# Patient Record
Sex: Female | Born: 1945
Health system: Southern US, Community
[De-identification: ages and names within clinical notes are randomized; demographics above are authoritative.]

## PROBLEM LIST (undated history)

## (undated) DIAGNOSIS — I517 Cardiomegaly: Secondary | ICD-10-CM

## (undated) DIAGNOSIS — I10 Essential (primary) hypertension: Secondary | ICD-10-CM

## (undated) DIAGNOSIS — M719 Bursopathy, unspecified: Secondary | ICD-10-CM

## (undated) DIAGNOSIS — M1990 Unspecified osteoarthritis, unspecified site: Secondary | ICD-10-CM

## (undated) DIAGNOSIS — N8110 Cystocele, unspecified: Secondary | ICD-10-CM

## (undated) DIAGNOSIS — J329 Chronic sinusitis, unspecified: Secondary | ICD-10-CM

## (undated) DIAGNOSIS — I872 Venous insufficiency (chronic) (peripheral): Secondary | ICD-10-CM

## (undated) DIAGNOSIS — J45909 Unspecified asthma, uncomplicated: Secondary | ICD-10-CM

## (undated) HISTORY — DX: Cardiomegaly: I51.7

## (undated) HISTORY — DX: Bursopathy, unspecified: M71.9

## (undated) HISTORY — DX: Cystocele, unspecified: N81.10

## (undated) HISTORY — DX: Unspecified osteoarthritis, unspecified site: M19.90

## (undated) HISTORY — DX: Unspecified asthma, uncomplicated: J45.909

## (undated) HISTORY — DX: Chronic sinusitis, unspecified: J32.9

## (undated) HISTORY — DX: Venous insufficiency (chronic) (peripheral): I87.2

## (undated) HISTORY — DX: Essential (primary) hypertension: I10

---

## 1996-07-25 HISTORY — PX: TOTAL ABDOMINAL HYSTERECTOMY: SHX209

## 2000-07-26 LAB — HM DEXA SCAN: HM Dexa Scan: NORMAL

## 2011-10-22 DIAGNOSIS — M5430 Sciatica, unspecified side: Secondary | ICD-10-CM | POA: Diagnosis not present

## 2011-12-08 DIAGNOSIS — M79609 Pain in unspecified limb: Secondary | ICD-10-CM | POA: Diagnosis not present

## 2011-12-08 DIAGNOSIS — Z90710 Acquired absence of both cervix and uterus: Secondary | ICD-10-CM | POA: Diagnosis not present

## 2011-12-08 DIAGNOSIS — M5430 Sciatica, unspecified side: Secondary | ICD-10-CM | POA: Diagnosis not present

## 2012-01-27 DIAGNOSIS — M5430 Sciatica, unspecified side: Secondary | ICD-10-CM | POA: Diagnosis not present

## 2012-02-03 DIAGNOSIS — M62838 Other muscle spasm: Secondary | ICD-10-CM | POA: Diagnosis not present

## 2012-02-14 DIAGNOSIS — M62838 Other muscle spasm: Secondary | ICD-10-CM | POA: Diagnosis not present

## 2012-02-17 DIAGNOSIS — M48061 Spinal stenosis, lumbar region without neurogenic claudication: Secondary | ICD-10-CM | POA: Diagnosis not present

## 2012-02-17 DIAGNOSIS — M1710 Unilateral primary osteoarthritis, unspecified knee: Secondary | ICD-10-CM | POA: Diagnosis not present

## 2012-02-20 DIAGNOSIS — M25559 Pain in unspecified hip: Secondary | ICD-10-CM | POA: Diagnosis not present

## 2012-02-20 DIAGNOSIS — M169 Osteoarthritis of hip, unspecified: Secondary | ICD-10-CM | POA: Diagnosis not present

## 2012-02-20 DIAGNOSIS — M1610 Unilateral primary osteoarthritis, unspecified hip: Secondary | ICD-10-CM | POA: Diagnosis not present

## 2012-02-24 DIAGNOSIS — M5126 Other intervertebral disc displacement, lumbar region: Secondary | ICD-10-CM | POA: Diagnosis not present

## 2012-02-24 DIAGNOSIS — M4310 Spondylolisthesis, site unspecified: Secondary | ICD-10-CM | POA: Diagnosis not present

## 2012-02-27 DIAGNOSIS — M48061 Spinal stenosis, lumbar region without neurogenic claudication: Secondary | ICD-10-CM | POA: Diagnosis not present

## 2012-02-27 DIAGNOSIS — M1710 Unilateral primary osteoarthritis, unspecified knee: Secondary | ICD-10-CM | POA: Diagnosis not present

## 2012-02-28 DIAGNOSIS — M1610 Unilateral primary osteoarthritis, unspecified hip: Secondary | ICD-10-CM | POA: Diagnosis not present

## 2012-03-20 DIAGNOSIS — L570 Actinic keratosis: Secondary | ICD-10-CM | POA: Diagnosis not present

## 2012-04-15 DIAGNOSIS — J209 Acute bronchitis, unspecified: Secondary | ICD-10-CM | POA: Diagnosis not present

## 2012-06-27 DIAGNOSIS — M1610 Unilateral primary osteoarthritis, unspecified hip: Secondary | ICD-10-CM | POA: Diagnosis not present

## 2012-07-02 DIAGNOSIS — M1610 Unilateral primary osteoarthritis, unspecified hip: Secondary | ICD-10-CM | POA: Diagnosis not present

## 2012-07-15 DIAGNOSIS — J209 Acute bronchitis, unspecified: Secondary | ICD-10-CM | POA: Diagnosis not present

## 2012-07-15 DIAGNOSIS — J019 Acute sinusitis, unspecified: Secondary | ICD-10-CM | POA: Diagnosis not present

## 2012-07-25 HISTORY — PX: TOTAL HIP ARTHROPLASTY: SHX124

## 2012-08-03 DIAGNOSIS — M1610 Unilateral primary osteoarthritis, unspecified hip: Secondary | ICD-10-CM | POA: Diagnosis not present

## 2012-08-29 DIAGNOSIS — N8111 Cystocele, midline: Secondary | ICD-10-CM | POA: Diagnosis not present

## 2012-08-29 DIAGNOSIS — M169 Osteoarthritis of hip, unspecified: Secondary | ICD-10-CM | POA: Diagnosis not present

## 2012-09-17 DIAGNOSIS — N8111 Cystocele, midline: Secondary | ICD-10-CM | POA: Diagnosis not present

## 2012-09-17 DIAGNOSIS — N952 Postmenopausal atrophic vaginitis: Secondary | ICD-10-CM | POA: Diagnosis not present

## 2012-09-20 DIAGNOSIS — Z1231 Encounter for screening mammogram for malignant neoplasm of breast: Secondary | ICD-10-CM | POA: Diagnosis not present

## 2012-09-21 DIAGNOSIS — N8110 Cystocele, unspecified: Secondary | ICD-10-CM | POA: Diagnosis not present

## 2012-09-21 DIAGNOSIS — N952 Postmenopausal atrophic vaginitis: Secondary | ICD-10-CM | POA: Diagnosis not present

## 2012-09-24 DIAGNOSIS — M5126 Other intervertebral disc displacement, lumbar region: Secondary | ICD-10-CM | POA: Diagnosis not present

## 2012-09-24 DIAGNOSIS — M1610 Unilateral primary osteoarthritis, unspecified hip: Secondary | ICD-10-CM | POA: Diagnosis not present

## 2012-11-18 DIAGNOSIS — J019 Acute sinusitis, unspecified: Secondary | ICD-10-CM | POA: Diagnosis not present

## 2012-11-18 DIAGNOSIS — R030 Elevated blood-pressure reading, without diagnosis of hypertension: Secondary | ICD-10-CM | POA: Diagnosis not present

## 2012-11-18 DIAGNOSIS — J209 Acute bronchitis, unspecified: Secondary | ICD-10-CM | POA: Diagnosis not present

## 2012-11-28 DIAGNOSIS — M5126 Other intervertebral disc displacement, lumbar region: Secondary | ICD-10-CM | POA: Diagnosis not present

## 2012-12-03 DIAGNOSIS — M169 Osteoarthritis of hip, unspecified: Secondary | ICD-10-CM | POA: Diagnosis present

## 2012-12-03 DIAGNOSIS — M25559 Pain in unspecified hip: Secondary | ICD-10-CM | POA: Diagnosis present

## 2012-12-03 DIAGNOSIS — R0789 Other chest pain: Secondary | ICD-10-CM | POA: Diagnosis present

## 2012-12-03 DIAGNOSIS — J45909 Unspecified asthma, uncomplicated: Secondary | ICD-10-CM | POA: Diagnosis present

## 2012-12-03 DIAGNOSIS — R5082 Postprocedural fever: Secondary | ICD-10-CM | POA: Diagnosis not present

## 2012-12-03 DIAGNOSIS — F411 Generalized anxiety disorder: Secondary | ICD-10-CM | POA: Diagnosis present

## 2012-12-03 DIAGNOSIS — M1610 Unilateral primary osteoarthritis, unspecified hip: Secondary | ICD-10-CM | POA: Diagnosis not present

## 2012-12-07 DIAGNOSIS — J45909 Unspecified asthma, uncomplicated: Secondary | ICD-10-CM | POA: Diagnosis not present

## 2012-12-07 DIAGNOSIS — Z96649 Presence of unspecified artificial hip joint: Secondary | ICD-10-CM | POA: Diagnosis not present

## 2012-12-07 DIAGNOSIS — Z471 Aftercare following joint replacement surgery: Secondary | ICD-10-CM | POA: Diagnosis not present

## 2012-12-07 DIAGNOSIS — M1610 Unilateral primary osteoarthritis, unspecified hip: Secondary | ICD-10-CM | POA: Diagnosis not present

## 2012-12-07 DIAGNOSIS — IMO0001 Reserved for inherently not codable concepts without codable children: Secondary | ICD-10-CM | POA: Diagnosis not present

## 2012-12-07 DIAGNOSIS — F411 Generalized anxiety disorder: Secondary | ICD-10-CM | POA: Diagnosis not present

## 2012-12-10 DIAGNOSIS — IMO0001 Reserved for inherently not codable concepts without codable children: Secondary | ICD-10-CM | POA: Diagnosis not present

## 2012-12-10 DIAGNOSIS — Z96649 Presence of unspecified artificial hip joint: Secondary | ICD-10-CM | POA: Diagnosis not present

## 2012-12-10 DIAGNOSIS — J45909 Unspecified asthma, uncomplicated: Secondary | ICD-10-CM | POA: Diagnosis not present

## 2012-12-10 DIAGNOSIS — S7000XA Contusion of unspecified hip, initial encounter: Secondary | ICD-10-CM | POA: Diagnosis not present

## 2012-12-10 DIAGNOSIS — M79609 Pain in unspecified limb: Secondary | ICD-10-CM | POA: Diagnosis not present

## 2012-12-10 DIAGNOSIS — M1610 Unilateral primary osteoarthritis, unspecified hip: Secondary | ICD-10-CM | POA: Diagnosis not present

## 2012-12-10 DIAGNOSIS — Z471 Aftercare following joint replacement surgery: Secondary | ICD-10-CM | POA: Diagnosis not present

## 2012-12-10 DIAGNOSIS — F411 Generalized anxiety disorder: Secondary | ICD-10-CM | POA: Diagnosis not present

## 2012-12-11 DIAGNOSIS — Z96649 Presence of unspecified artificial hip joint: Secondary | ICD-10-CM | POA: Diagnosis not present

## 2012-12-11 DIAGNOSIS — F411 Generalized anxiety disorder: Secondary | ICD-10-CM | POA: Diagnosis not present

## 2012-12-11 DIAGNOSIS — IMO0001 Reserved for inherently not codable concepts without codable children: Secondary | ICD-10-CM | POA: Diagnosis not present

## 2012-12-11 DIAGNOSIS — M1610 Unilateral primary osteoarthritis, unspecified hip: Secondary | ICD-10-CM | POA: Diagnosis not present

## 2012-12-11 DIAGNOSIS — J45909 Unspecified asthma, uncomplicated: Secondary | ICD-10-CM | POA: Diagnosis not present

## 2012-12-11 DIAGNOSIS — Z471 Aftercare following joint replacement surgery: Secondary | ICD-10-CM | POA: Diagnosis not present

## 2012-12-13 DIAGNOSIS — Z471 Aftercare following joint replacement surgery: Secondary | ICD-10-CM | POA: Diagnosis not present

## 2012-12-13 DIAGNOSIS — Z96649 Presence of unspecified artificial hip joint: Secondary | ICD-10-CM | POA: Diagnosis not present

## 2012-12-13 DIAGNOSIS — Z90710 Acquired absence of both cervix and uterus: Secondary | ICD-10-CM | POA: Diagnosis not present

## 2012-12-13 DIAGNOSIS — Z7982 Long term (current) use of aspirin: Secondary | ICD-10-CM | POA: Diagnosis not present

## 2012-12-13 DIAGNOSIS — IMO0001 Reserved for inherently not codable concepts without codable children: Secondary | ICD-10-CM | POA: Diagnosis not present

## 2012-12-13 DIAGNOSIS — F411 Generalized anxiety disorder: Secondary | ICD-10-CM | POA: Diagnosis not present

## 2012-12-13 DIAGNOSIS — J45909 Unspecified asthma, uncomplicated: Secondary | ICD-10-CM | POA: Diagnosis not present

## 2012-12-13 DIAGNOSIS — Z9181 History of falling: Secondary | ICD-10-CM | POA: Diagnosis not present

## 2012-12-17 DIAGNOSIS — IMO0001 Reserved for inherently not codable concepts without codable children: Secondary | ICD-10-CM | POA: Diagnosis not present

## 2012-12-17 DIAGNOSIS — Z9181 History of falling: Secondary | ICD-10-CM | POA: Diagnosis not present

## 2012-12-17 DIAGNOSIS — J45909 Unspecified asthma, uncomplicated: Secondary | ICD-10-CM | POA: Diagnosis not present

## 2012-12-17 DIAGNOSIS — Z471 Aftercare following joint replacement surgery: Secondary | ICD-10-CM | POA: Diagnosis not present

## 2012-12-17 DIAGNOSIS — F411 Generalized anxiety disorder: Secondary | ICD-10-CM | POA: Diagnosis not present

## 2012-12-17 DIAGNOSIS — Z96649 Presence of unspecified artificial hip joint: Secondary | ICD-10-CM | POA: Diagnosis not present

## 2012-12-20 DIAGNOSIS — F411 Generalized anxiety disorder: Secondary | ICD-10-CM | POA: Diagnosis not present

## 2012-12-20 DIAGNOSIS — Z9181 History of falling: Secondary | ICD-10-CM | POA: Diagnosis not present

## 2012-12-20 DIAGNOSIS — Z471 Aftercare following joint replacement surgery: Secondary | ICD-10-CM | POA: Diagnosis not present

## 2012-12-20 DIAGNOSIS — Z96649 Presence of unspecified artificial hip joint: Secondary | ICD-10-CM | POA: Diagnosis not present

## 2012-12-20 DIAGNOSIS — IMO0001 Reserved for inherently not codable concepts without codable children: Secondary | ICD-10-CM | POA: Diagnosis not present

## 2012-12-20 DIAGNOSIS — J45909 Unspecified asthma, uncomplicated: Secondary | ICD-10-CM | POA: Diagnosis not present

## 2012-12-21 DIAGNOSIS — F411 Generalized anxiety disorder: Secondary | ICD-10-CM | POA: Diagnosis not present

## 2012-12-21 DIAGNOSIS — IMO0001 Reserved for inherently not codable concepts without codable children: Secondary | ICD-10-CM | POA: Diagnosis not present

## 2012-12-21 DIAGNOSIS — Z471 Aftercare following joint replacement surgery: Secondary | ICD-10-CM | POA: Diagnosis not present

## 2012-12-21 DIAGNOSIS — Z96649 Presence of unspecified artificial hip joint: Secondary | ICD-10-CM | POA: Diagnosis not present

## 2012-12-21 DIAGNOSIS — J45909 Unspecified asthma, uncomplicated: Secondary | ICD-10-CM | POA: Diagnosis not present

## 2012-12-21 DIAGNOSIS — Z9181 History of falling: Secondary | ICD-10-CM | POA: Diagnosis not present

## 2012-12-24 DIAGNOSIS — J45909 Unspecified asthma, uncomplicated: Secondary | ICD-10-CM | POA: Diagnosis not present

## 2012-12-24 DIAGNOSIS — Z471 Aftercare following joint replacement surgery: Secondary | ICD-10-CM | POA: Diagnosis not present

## 2012-12-24 DIAGNOSIS — F411 Generalized anxiety disorder: Secondary | ICD-10-CM | POA: Diagnosis not present

## 2012-12-24 DIAGNOSIS — Z96649 Presence of unspecified artificial hip joint: Secondary | ICD-10-CM | POA: Diagnosis not present

## 2012-12-24 DIAGNOSIS — Z9181 History of falling: Secondary | ICD-10-CM | POA: Diagnosis not present

## 2012-12-24 DIAGNOSIS — IMO0001 Reserved for inherently not codable concepts without codable children: Secondary | ICD-10-CM | POA: Diagnosis not present

## 2012-12-26 DIAGNOSIS — F411 Generalized anxiety disorder: Secondary | ICD-10-CM | POA: Diagnosis not present

## 2012-12-26 DIAGNOSIS — Z471 Aftercare following joint replacement surgery: Secondary | ICD-10-CM | POA: Diagnosis not present

## 2012-12-26 DIAGNOSIS — Z9181 History of falling: Secondary | ICD-10-CM | POA: Diagnosis not present

## 2012-12-26 DIAGNOSIS — IMO0001 Reserved for inherently not codable concepts without codable children: Secondary | ICD-10-CM | POA: Diagnosis not present

## 2012-12-26 DIAGNOSIS — J45909 Unspecified asthma, uncomplicated: Secondary | ICD-10-CM | POA: Diagnosis not present

## 2012-12-26 DIAGNOSIS — Z96649 Presence of unspecified artificial hip joint: Secondary | ICD-10-CM | POA: Diagnosis not present

## 2012-12-28 DIAGNOSIS — IMO0001 Reserved for inherently not codable concepts without codable children: Secondary | ICD-10-CM | POA: Diagnosis not present

## 2012-12-28 DIAGNOSIS — Z9181 History of falling: Secondary | ICD-10-CM | POA: Diagnosis not present

## 2012-12-28 DIAGNOSIS — J45909 Unspecified asthma, uncomplicated: Secondary | ICD-10-CM | POA: Diagnosis not present

## 2012-12-28 DIAGNOSIS — F411 Generalized anxiety disorder: Secondary | ICD-10-CM | POA: Diagnosis not present

## 2012-12-28 DIAGNOSIS — Z96649 Presence of unspecified artificial hip joint: Secondary | ICD-10-CM | POA: Diagnosis not present

## 2012-12-28 DIAGNOSIS — Z471 Aftercare following joint replacement surgery: Secondary | ICD-10-CM | POA: Diagnosis not present

## 2013-01-01 DIAGNOSIS — J45909 Unspecified asthma, uncomplicated: Secondary | ICD-10-CM | POA: Diagnosis not present

## 2013-01-01 DIAGNOSIS — IMO0001 Reserved for inherently not codable concepts without codable children: Secondary | ICD-10-CM | POA: Diagnosis not present

## 2013-01-01 DIAGNOSIS — F411 Generalized anxiety disorder: Secondary | ICD-10-CM | POA: Diagnosis not present

## 2013-01-01 DIAGNOSIS — Z96649 Presence of unspecified artificial hip joint: Secondary | ICD-10-CM | POA: Diagnosis not present

## 2013-01-01 DIAGNOSIS — Z9181 History of falling: Secondary | ICD-10-CM | POA: Diagnosis not present

## 2013-01-01 DIAGNOSIS — Z471 Aftercare following joint replacement surgery: Secondary | ICD-10-CM | POA: Diagnosis not present

## 2013-01-03 DIAGNOSIS — Z9181 History of falling: Secondary | ICD-10-CM | POA: Diagnosis not present

## 2013-01-03 DIAGNOSIS — F411 Generalized anxiety disorder: Secondary | ICD-10-CM | POA: Diagnosis not present

## 2013-01-03 DIAGNOSIS — Z471 Aftercare following joint replacement surgery: Secondary | ICD-10-CM | POA: Diagnosis not present

## 2013-01-03 DIAGNOSIS — IMO0001 Reserved for inherently not codable concepts without codable children: Secondary | ICD-10-CM | POA: Diagnosis not present

## 2013-01-03 DIAGNOSIS — J45909 Unspecified asthma, uncomplicated: Secondary | ICD-10-CM | POA: Diagnosis not present

## 2013-01-03 DIAGNOSIS — Z96649 Presence of unspecified artificial hip joint: Secondary | ICD-10-CM | POA: Diagnosis not present

## 2013-03-12 DIAGNOSIS — N8111 Cystocele, midline: Secondary | ICD-10-CM | POA: Diagnosis not present

## 2013-03-12 DIAGNOSIS — R5381 Other malaise: Secondary | ICD-10-CM | POA: Diagnosis not present

## 2013-03-14 LAB — TSH: TSH: 1.26 u[IU]/mL (ref <=5.90)

## 2013-03-14 LAB — BASIC METABOLIC PANEL
BUN: 16 mg/dL (ref 4–21)
Creatinine: 0.5 mg/dL (ref <=1.1)

## 2013-03-14 LAB — CBC AND DIFFERENTIAL: Hemoglobin: 11.3 g/dL — AB (ref 12.0–16.0)

## 2013-04-15 DIAGNOSIS — J209 Acute bronchitis, unspecified: Secondary | ICD-10-CM | POA: Diagnosis not present

## 2013-04-26 DIAGNOSIS — M1610 Unilateral primary osteoarthritis, unspecified hip: Secondary | ICD-10-CM | POA: Diagnosis not present

## 2013-06-12 DIAGNOSIS — J209 Acute bronchitis, unspecified: Secondary | ICD-10-CM | POA: Diagnosis not present

## 2013-06-12 DIAGNOSIS — J019 Acute sinusitis, unspecified: Secondary | ICD-10-CM | POA: Diagnosis not present

## 2014-05-26 DIAGNOSIS — Z1231 Encounter for screening mammogram for malignant neoplasm of breast: Secondary | ICD-10-CM | POA: Diagnosis not present

## 2014-05-26 LAB — HM MAMMOGRAPHY: HM Mammogram: NORMAL

## 2014-08-08 DIAGNOSIS — M1611 Unilateral primary osteoarthritis, right hip: Secondary | ICD-10-CM | POA: Diagnosis not present

## 2014-08-08 DIAGNOSIS — J329 Chronic sinusitis, unspecified: Secondary | ICD-10-CM | POA: Diagnosis not present

## 2014-08-08 DIAGNOSIS — B9689 Other specified bacterial agents as the cause of diseases classified elsewhere: Secondary | ICD-10-CM | POA: Diagnosis not present

## 2014-08-19 DIAGNOSIS — S46021A Laceration of muscle(s) and tendon(s) of the rotator cuff of right shoulder, initial encounter: Secondary | ICD-10-CM | POA: Diagnosis not present

## 2014-08-22 DIAGNOSIS — M25511 Pain in right shoulder: Secondary | ICD-10-CM | POA: Diagnosis not present

## 2014-08-26 DIAGNOSIS — M25511 Pain in right shoulder: Secondary | ICD-10-CM | POA: Diagnosis not present

## 2014-08-29 DIAGNOSIS — M25511 Pain in right shoulder: Secondary | ICD-10-CM | POA: Diagnosis not present

## 2014-12-04 DIAGNOSIS — W19XXXA Unspecified fall, initial encounter: Secondary | ICD-10-CM | POA: Diagnosis not present

## 2014-12-04 DIAGNOSIS — S01411A Laceration without foreign body of right cheek and temporomandibular area, initial encounter: Secondary | ICD-10-CM | POA: Diagnosis not present

## 2014-12-04 DIAGNOSIS — Z23 Encounter for immunization: Secondary | ICD-10-CM | POA: Diagnosis not present

## 2014-12-14 DIAGNOSIS — Z4802 Encounter for removal of sutures: Secondary | ICD-10-CM | POA: Diagnosis not present

## 2014-12-14 DIAGNOSIS — S01411D Laceration without foreign body of right cheek and temporomandibular area, subsequent encounter: Secondary | ICD-10-CM | POA: Diagnosis not present

## 2015-03-16 DIAGNOSIS — M25511 Pain in right shoulder: Secondary | ICD-10-CM | POA: Diagnosis not present

## 2015-03-27 DIAGNOSIS — M25511 Pain in right shoulder: Secondary | ICD-10-CM | POA: Diagnosis not present

## 2015-03-27 DIAGNOSIS — M1611 Unilateral primary osteoarthritis, right hip: Secondary | ICD-10-CM | POA: Diagnosis not present

## 2015-05-19 ENCOUNTER — Encounter: Payer: Self-pay | Admitting: Internal Medicine

## 2015-05-19 ENCOUNTER — Other Ambulatory Visit: Payer: Self-pay | Admitting: Internal Medicine

## 2015-05-19 DIAGNOSIS — IMO0002 Reserved for concepts with insufficient information to code with codable children: Secondary | ICD-10-CM | POA: Insufficient documentation

## 2015-05-19 DIAGNOSIS — M169 Osteoarthritis of hip, unspecified: Secondary | ICD-10-CM

## 2015-05-19 DIAGNOSIS — Z96641 Presence of right artificial hip joint: Secondary | ICD-10-CM | POA: Insufficient documentation

## 2015-05-25 DIAGNOSIS — S61205A Unspecified open wound of left ring finger without damage to nail, initial encounter: Secondary | ICD-10-CM | POA: Diagnosis not present

## 2015-10-06 DIAGNOSIS — M25511 Pain in right shoulder: Secondary | ICD-10-CM | POA: Diagnosis not present

## 2015-11-15 DIAGNOSIS — R05 Cough: Secondary | ICD-10-CM | POA: Diagnosis not present

## 2015-11-15 DIAGNOSIS — J45901 Unspecified asthma with (acute) exacerbation: Secondary | ICD-10-CM | POA: Diagnosis not present

## 2015-11-15 DIAGNOSIS — J019 Acute sinusitis, unspecified: Secondary | ICD-10-CM | POA: Diagnosis not present

## 2015-11-16 ENCOUNTER — Encounter: Payer: Self-pay | Admitting: Internal Medicine

## 2015-11-16 ENCOUNTER — Ambulatory Visit (INDEPENDENT_AMBULATORY_CARE_PROVIDER_SITE_OTHER): Payer: Medicare Other | Admitting: Internal Medicine

## 2015-11-16 VITALS — BP 156/86 | HR 89 | Temp 98.4°F | Ht 64.0 in | Wt 192.0 lb

## 2015-11-16 DIAGNOSIS — J40 Bronchitis, not specified as acute or chronic: Secondary | ICD-10-CM

## 2015-11-16 MED ORDER — HYDROCOD POLST-CPM POLST ER 10-8 MG/5ML PO SUER: 5.0000 mL | Freq: Two times a day (BID) | ORAL | Status: DC

## 2015-11-16 NOTE — Patient Instructions (Signed)

## 2015-11-16 NOTE — Progress Notes (Signed)
    Date:  11/16/2015   Name:  Hayley Ewing   DOB:  22-Jan-1946   MRN:  [id number]   Chief Complaint: Cough Cough This is a new problem. The current episode started in the past 7 days. The problem occurs every few minutes. The cough is productive of sputum. Associated symptoms include wheezing. Pertinent negatives include no chest pain, chills, fever, headaches, sore throat or shortness of breath.  Patient complains of cough for 1 week. She states that husband had this before here. Patient states that went to Urgent Care in [place] yesterday. She states that she was placed on Doxycycline, albuterol, benzonate, and prednisone. She states that she needs some stronger cough medication. She is unable to sleep due to cough.   Review of Systems  Constitutional: Positive for fatigue. Negative for fever and chills.  HENT: Negative for mouth sores and sore throat.   Respiratory: Positive for cough, chest tightness and wheezing. Negative for shortness of breath.   Cardiovascular: Negative for chest pain and palpitations.  Neurological: Negative for dizziness and headaches.    Patient Active Problem List   Diagnosis Date Noted  . Bladder cystocele 05/19/2015  . Degenerative arthritis of hip 05/19/2015    Prior to Admission medications   Medication Sig Start Date End Date Taking? Authorizing Provider  benzonatate (TESSALON) 200 MG capsule Take 1 capsule by mouth 3 (three) times daily. 11/15/15  Yes Historical Provider, MD  doxycycline (VIBRAMYCIN) 100 MG capsule Take 1 capsule by mouth 2 (two) times daily. 11/15/15  Yes Historical Provider, MD  predniSONE (DELTASONE) 20 MG tablet Take 1 tablet by mouth 3 (three) times daily. 11/15/15  Yes Historical Provider, MD  VENTOLIN HFA 108 (90 Base) MCG/ACT inhaler Inhale 1 puff into the lungs every 4 (four) hours as needed. 11/15/15  Yes Historical Provider, MD    No Known Allergies  Past Surgical History  Procedure Laterality Date  . Total abdominal  hysterectomy  1998  . Total hip arthroplasty Right 2014    Social History  Substance Use Topics  . Smoking status: Never Smoker   . Smokeless tobacco: None  . Alcohol Use: 1.2 oz/week    2 Standard drinks or equivalent per week     Medication list has been reviewed and updated.   Physical Exam  Constitutional: She is oriented to person, place, and time. She appears well-developed. No distress.  HENT:  Head: Normocephalic and atraumatic.  Neck: Normal range of motion. Neck supple.  Cardiovascular: Normal rate, regular rhythm and normal heart sounds.   Pulmonary/Chest: Effort normal. No respiratory distress. She has decreased breath sounds. She has no wheezes. She has no rales.  Musculoskeletal: Normal range of motion.  Lymphadenopathy:    She has no cervical adenopathy.  Neurological: She is alert and oriented to person, place, and time.  Skin: Skin is warm and dry. No rash noted.  Psychiatric: She has a normal mood and affect. Her behavior is normal. Thought content normal.    BP 156/86 mmHg  Pulse 89  Temp(Src) 98.4 F (36.9 C) (Oral)  Ht 5\' 4"  (1.626 m)  Wt 192 lb (87.091 kg)  BMI 32.94 kg/m2  SpO2 100%  Assessment and Plan: 1. Bronchitis Complete Doxycycline, Prednisone and fluids - chlorpheniramine-HYDROcodone (TUSSIONEX PENNKINETIC ER) 10-8 MG/5ML SUER; Take 5 mLs by mouth 2 (two) times daily.  Dispense: 473 mL; Refill: 0   , MD [facility]  11/16/2015

## 2015-11-24 ENCOUNTER — Encounter: Payer: Self-pay | Admitting: Internal Medicine

## 2015-11-24 ENCOUNTER — Telehealth: Payer: Self-pay

## 2015-11-24 ENCOUNTER — Ambulatory Visit (INDEPENDENT_AMBULATORY_CARE_PROVIDER_SITE_OTHER): Payer: Medicare Other | Admitting: Internal Medicine

## 2015-11-24 VITALS — BP 112/80 | HR 76 | Temp 98.1°F | Resp 16 | Ht 64.0 in | Wt 195.0 lb

## 2015-11-24 DIAGNOSIS — J40 Bronchitis, not specified as acute or chronic: Secondary | ICD-10-CM

## 2015-11-24 MED ORDER — LEVOFLOXACIN 500 MG PO TABS: 500.0000 mg | ORAL_TABLET | Freq: Every day | ORAL | Status: DC

## 2015-11-24 MED ORDER — METHYLPREDNISOLONE 4 MG PO TBPK: ORAL_TABLET | ORAL | Status: DC

## 2015-11-24 NOTE — Patient Instructions (Signed)

## 2015-11-24 NOTE — Progress Notes (Signed)
Date:  11/24/2015   Name:  Hayley Ewing   DOB:  1946-07-22   MRN:  [id number]   Chief Complaint: Cough Cough This is a chronic problem. The current episode started 1 to 4 weeks ago. The problem has been unchanged. The problem occurs hourly. The cough is non-productive. Associated symptoms include wheezing. Pertinent negatives include no chest pain, chills, fever or shortness of breath.  She did not rest during the acute phase.  Now having persistent cough with yellow phlegm at times, ongoing wheezing but it not using inhaler regularly.  She is resting at night with cough syrup.  Review of Systems  Constitutional: Negative for fever, chills and fatigue.  Respiratory: Positive for cough and wheezing. Negative for choking, chest tightness and shortness of breath.   Cardiovascular: Negative for chest pain and palpitations.    Patient Active Problem List   Diagnosis Date Noted  . Bladder cystocele 05/19/2015  . Degenerative arthritis of hip 05/19/2015    Prior to Admission medications   Medication Sig Start Date End Date Taking? Authorizing Provider  budesonide-formoterol (SYMBICORT) 80-4.5 MCG/ACT inhaler  11/29/07  Yes Historical Provider, MD  chlorpheniramine-HYDROcodone (TUSSIONEX PENNKINETIC ER) 10-8 MG/5ML SUER Take 5 mLs by mouth 2 (two) times daily. 11/16/15  Yes Reubin Milan, MD  doxycycline (VIBRAMYCIN) 100 MG capsule Take 1 capsule by mouth 2 (two) times daily. 11/15/15  Yes Historical Provider, MD  HYDROcodone-acetaminophen (NORCO) 10-325 MG tablet  10/27/15  Yes Historical Provider, MD  VENTOLIN HFA 108 (90 Base) MCG/ACT inhaler Inhale 1 puff into the lungs every 4 (four) hours as needed. 11/15/15  Yes Historical Provider, MD    No Known Allergies  Past Surgical History  Procedure Laterality Date  . Total abdominal hysterectomy  1998  . Total hip arthroplasty Right 2014    Social History  Substance Use Topics  . Smoking status: Never Smoker   . Smokeless tobacco:  None  . Alcohol Use: 1.2 oz/week    2 Standard drinks or equivalent per week     Medication list has been reviewed and updated.   Physical Exam  Constitutional: She is oriented to person, place, and time. She appears well-developed. She has a sickly appearance. No distress.  HENT:  Head: Normocephalic and atraumatic.  Neck: Normal range of motion. Neck supple.  Cardiovascular: Normal rate, regular rhythm and normal heart sounds.   Pulmonary/Chest: Effort normal. No respiratory distress. She has wheezes. She has no rales.  Musculoskeletal: Normal range of motion.  Lymphadenopathy:    She has no cervical adenopathy.  Neurological: She is alert and oriented to person, place, and time.  Skin: Skin is warm and dry. No rash noted.  Psychiatric: She has a normal mood and affect. Her behavior is normal. Thought content normal.  Nursing note and vitals reviewed.   BP 112/80 mmHg  Pulse 76  Temp(Src) 98.1 F (36.7 C) (Oral)  Resp 16  Ht 5\' 4"  (1.626 m)  Wt 195 lb (88.451 kg)  BMI 33.46 kg/m2  SpO2 94%  Assessment and Plan: 1. Bronchitis Recommend rest and adequate fluids - levofloxacin (LEVAQUIN) 500 MG tablet; Take 1 tablet (500 mg total) by mouth daily.  Dispense: 7 tablet; Refill: 0 - methylPREDNISolone (MEDROL DOSEPAK) 4 MG TBPK tablet; Take 6 pills on day 1 the 5 pills day 2 then 4 pills day 3 then 3 pills day 4 then 2 pills day 5 then one pills day 6 then stop  Dispense: 21 tablet; Refill: 0  Bari Edward, MD [facility]  11/24/2015

## 2015-11-24 NOTE — Telephone Encounter (Signed)
Called to report no better. Sounded tight and SOB as well as horse. Advised [facility]

## 2016-01-04 ENCOUNTER — Telehealth: Payer: Self-pay | Admitting: Internal Medicine

## 2016-01-04 ENCOUNTER — Other Ambulatory Visit: Payer: Self-pay | Admitting: Internal Medicine

## 2016-01-04 MED ORDER — CEFDINIR 300 MG PO CAPS: 300.0000 mg | ORAL_CAPSULE | Freq: Two times a day (BID) | ORAL | Status: DC

## 2016-01-04 NOTE — Telephone Encounter (Signed)
Pt called and said she is still coughing and coughing up mucus, and is asking if we can call another antibiotic, and send to to [facility] in [place].

## 2016-01-07 NOTE — Progress Notes (Signed)
advised

## 2016-03-09 DIAGNOSIS — M1612 Unilateral primary osteoarthritis, left hip: Secondary | ICD-10-CM | POA: Diagnosis not present

## 2016-03-14 DIAGNOSIS — M25552 Pain in left hip: Secondary | ICD-10-CM | POA: Diagnosis not present

## 2016-03-21 ENCOUNTER — Other Ambulatory Visit: Payer: Self-pay

## 2016-03-30 DIAGNOSIS — G894 Chronic pain syndrome: Secondary | ICD-10-CM | POA: Diagnosis not present

## 2016-03-31 DIAGNOSIS — G894 Chronic pain syndrome: Secondary | ICD-10-CM | POA: Diagnosis not present

## 2016-03-31 DIAGNOSIS — Z79891 Long term (current) use of opiate analgesic: Secondary | ICD-10-CM | POA: Diagnosis not present

## 2016-04-17 DIAGNOSIS — S20211A Contusion of right front wall of thorax, initial encounter: Secondary | ICD-10-CM | POA: Diagnosis not present

## 2016-05-06 DIAGNOSIS — M79661 Pain in right lower leg: Secondary | ICD-10-CM | POA: Diagnosis not present

## 2016-05-06 DIAGNOSIS — Z79891 Long term (current) use of opiate analgesic: Secondary | ICD-10-CM | POA: Diagnosis not present

## 2016-05-06 DIAGNOSIS — Z7982 Long term (current) use of aspirin: Secondary | ICD-10-CM | POA: Diagnosis not present

## 2016-05-06 DIAGNOSIS — M79604 Pain in right leg: Secondary | ICD-10-CM | POA: Diagnosis not present

## 2016-05-31 DIAGNOSIS — M79661 Pain in right lower leg: Secondary | ICD-10-CM | POA: Diagnosis not present

## 2016-05-31 DIAGNOSIS — G894 Chronic pain syndrome: Secondary | ICD-10-CM | POA: Diagnosis not present

## 2016-06-14 ENCOUNTER — Encounter: Payer: Self-pay | Admitting: Internal Medicine

## 2016-06-14 ENCOUNTER — Ambulatory Visit (INDEPENDENT_AMBULATORY_CARE_PROVIDER_SITE_OTHER): Payer: Medicare Other | Admitting: Internal Medicine

## 2016-06-14 VITALS — BP 118/80 | HR 86 | Ht 64.0 in | Wt 204.0 lb

## 2016-06-14 DIAGNOSIS — I872 Venous insufficiency (chronic) (peripheral): Secondary | ICD-10-CM | POA: Diagnosis not present

## 2016-06-14 MED ORDER — FUROSEMIDE 20 MG PO TABS: 20.0000 mg | ORAL_TABLET | Freq: Every day | ORAL | 0 refills | Status: DC | PRN

## 2016-06-14 NOTE — Progress Notes (Signed)
    Date:  06/14/2016   Name:  Hayley Ewing   DOB:  Jan 17, 1946   MRN:  [id number]   Chief Complaint: Edema (Both feet and ankles are swelling, but left is worse than right. Working and standing on feet all day causes it to be worse. Elevating it brings the swelling down, but swelling comes right back once standing on feet.)  She has been working 9 hours days at [employer] - standing the whole time.  Also eating more salty foods. Has had some edema in the past but not severe.  Usually elevates her legs after work but has not done so recently due to holiday preparations.  She has compression stockings but has not been wearing them.  Review of Systems  Constitutional: Negative for chills, fatigue and fever.  Respiratory: Negative for cough, chest tightness and shortness of breath.   Cardiovascular: Positive for leg swelling. Negative for chest pain and palpitations.  Skin: Negative for rash and wound.    Patient Active Problem List   Diagnosis Date Noted  . Bladder cystocele 05/19/2015  . Degenerative arthritis of hip 05/19/2015    Prior to Admission medications   Medication Sig Start Date End Date Taking? Authorizing Provider  budesonide-formoterol (SYMBICORT) 80-4.5 MCG/ACT inhaler  11/29/07  Yes Historical Provider, MD  chlorpheniramine-HYDROcodone (TUSSIONEX PENNKINETIC ER) 10-8 MG/5ML SUER Take 5 mLs by mouth 2 (two) times daily. 11/16/15  Yes Reubin Milan, MD  HYDROcodone-acetaminophen [facility]) 10-325 MG tablet  10/27/15  Yes Historical Provider, MD  VENTOLIN HFA 108 (90 Base) MCG/ACT inhaler Inhale 1 puff into the lungs every 4 (four) hours as needed. 11/15/15  Yes Historical Provider, MD    No Known Allergies  Past Surgical History:  Procedure Laterality Date  . TOTAL ABDOMINAL HYSTERECTOMY  1998  . TOTAL HIP ARTHROPLASTY Right 2014    Social History  Substance Use Topics  . Smoking status: Never Smoker  . Smokeless tobacco: Not on file  . Alcohol use 1.2 oz/week    2 Standard drinks or equivalent per week     Medication list has been reviewed and updated.   Physical Exam  Constitutional: She is oriented to person, place, and time. She appears well-developed. No distress.  HENT:  Head: Normocephalic and atraumatic.  Neck: Normal range of motion. Neck supple.  Cardiovascular: Normal rate, regular rhythm and normal heart sounds.   Varicose veins and spider veins of both LEs No cord or evidence of DVT  Pulmonary/Chest: Effort normal and breath sounds normal. No respiratory distress. She has no wheezes.  Musculoskeletal: She exhibits edema and tenderness.  Neurological: She is alert and oriented to person, place, and time.  Skin: Skin is warm and dry. No rash noted.  Psychiatric: She has a normal mood and affect. Her behavior is normal. Thought content normal.  Nursing note and vitals reviewed.   BP 118/80   Pulse 86   Ht 5\' 4"  (1.626 m)   Wt 204 lb (92.5 kg)   SpO2 96%   BMI 35.02 kg/m   Assessment and Plan: 1. Venous insufficiency of both lower extremities Compression stockings daily Lasix 1-2 times per week as needed DASH diet Elevate as able - furosemide (LASIX) 20 MG tablet; Take 1 tablet (20 mg total) by mouth daily as needed.  Dispense: 30 tablet; Refill: 0   , MD [facility]  06/14/2016

## 2016-06-14 NOTE — Patient Instructions (Signed)
DASH Eating Plan DASH stands for "Dietary Approaches to Stop Hypertension." The DASH eating plan is a healthy eating plan that has been shown to reduce high blood pressure (hypertension). Additional health benefits may include reducing the risk of type 2 diabetes mellitus, heart disease, and stroke. The DASH eating plan may also help with weight loss. What do I need to know about the DASH eating plan? For the DASH eating plan, you will follow these general guidelines:  Choose foods with less than 150 milligrams of sodium per serving (as listed on the food label).  Use salt-free seasonings or herbs instead of table salt or sea salt.  Check with your health care provider or pharmacist before using salt substitutes.  Eat lower-sodium products. These are often labeled as "low-sodium" or "no salt added."  Eat fresh foods. Avoid eating a lot of canned foods.  Eat more vegetables, fruits, and low-fat dairy products.  Choose whole grains. Look for the word "whole" as the first word in the ingredient list.  Choose fish and skinless chicken or turkey more often than red meat. Limit fish, poultry, and meat to 6 oz (170 g) each day.  Limit sweets, desserts, sugars, and sugary drinks.  Choose heart-healthy fats.  Eat more home-cooked food and less restaurant, buffet, and fast food.  Limit fried foods.  Do not fry foods. Cook foods using methods such as baking, boiling, grilling, and broiling instead.  When eating at a restaurant, ask that your food be prepared with less salt, or no salt if possible. What foods can I eat? Seek help from a dietitian for individual calorie needs. Grains  Whole grain or whole wheat bread. Brown rice. Whole grain or whole wheat pasta. Quinoa, bulgur, and whole grain cereals. Low-sodium cereals. Corn or whole wheat flour tortillas. Whole grain cornbread. Whole grain crackers. Low-sodium crackers. Vegetables  Fresh or frozen vegetables (raw, steamed, roasted, or  grilled). Low-sodium or reduced-sodium tomato and vegetable juices. Low-sodium or reduced-sodium tomato sauce and paste. Low-sodium or reduced-sodium canned vegetables. Fruits  All fresh, canned (in natural juice), or frozen fruits. Meat and Other Protein Products  Ground beef (85% or leaner), grass-fed beef, or beef trimmed of fat. Skinless chicken or turkey. Ground chicken or turkey. Pork trimmed of fat. All fish and seafood. Eggs. Dried beans, peas, or lentils. Unsalted nuts and seeds. Unsalted canned beans. Dairy  Low-fat dairy products, such as skim or 1% milk, 2% or reduced-fat cheeses, low-fat ricotta or cottage cheese, or plain low-fat yogurt. Low-sodium or reduced-sodium cheeses. Fats and Oils  Tub margarines without trans fats. Light or reduced-fat mayonnaise and salad dressings (reduced sodium). Avocado. Safflower, olive, or canola oils. Natural peanut or almond butter. Other  Unsalted popcorn and pretzels. The items listed above may not be a complete list of recommended foods or beverages. Contact your dietitian for more options.  What foods are not recommended? Grains  White bread. White pasta. White rice. Refined cornbread. Bagels and croissants. Crackers that contain trans fat. Vegetables  Creamed or fried vegetables. Vegetables in a cheese sauce. Regular canned vegetables. Regular canned tomato sauce and paste. Regular tomato and vegetable juices. Fruits  Canned fruit in light or heavy syrup. Fruit juice. Meat and Other Protein Products  Fatty cuts of meat. Ribs, chicken wings, bacon, sausage, bologna, salami, chitterlings, fatback, hot dogs, bratwurst, and packaged luncheon meats. Salted nuts and seeds. Canned beans with salt. Dairy  Whole or 2% milk, cream, half-and-half, and cream cheese. Whole-fat or sweetened yogurt. Full-fat cheeses   or blue cheese. Nondairy creamers and whipped toppings. Processed cheese, cheese spreads, or cheese curds. Condiments  Onion and garlic  salt, seasoned salt, table salt, and sea salt. Canned and packaged gravies. Worcestershire sauce. Tartar sauce. Barbecue sauce. Teriyaki sauce. Soy sauce, including reduced sodium. Steak sauce. Fish sauce. Oyster sauce. Cocktail sauce. Horseradish. Ketchup and mustard. Meat flavorings and tenderizers. Bouillon cubes. Hot sauce. Tabasco sauce. Marinades. Taco seasonings. Relishes. Fats and Oils  Butter, stick margarine, lard, shortening, ghee, and bacon fat. Coconut, palm kernel, or palm oils. Regular salad dressings. Other  Pickles and olives. Salted popcorn and pretzels. The items listed above may not be a complete list of foods and beverages to avoid. Contact your dietitian for more information.  Where can I find more information? National Heart, Lung, and Blood Institute: www.nhlbi.nih.gov/health/health-topics/topics/dash/ This information is not intended to replace advice given to you by your health care provider. Make sure you discuss any questions you have with your health care provider. Document Released: 06/30/2011 Document Revised: 12/17/2015 Document Reviewed: 05/15/2013 Elsevier Interactive Patient Education  2017 Elsevier Inc.  

## 2016-08-05 ENCOUNTER — Encounter: Payer: Self-pay | Admitting: Internal Medicine

## 2016-08-05 ENCOUNTER — Ambulatory Visit
Admission: RE | Admit: 2016-08-05 | Discharge: 2016-08-05 | Disposition: A | Discharge status: Home / Self Care | Payer: Medicare Other | Source: Ambulatory Visit | Attending: Internal Medicine | Admitting: Internal Medicine

## 2016-08-05 ENCOUNTER — Ambulatory Visit (INDEPENDENT_AMBULATORY_CARE_PROVIDER_SITE_OTHER): Payer: Medicare Other | Admitting: Internal Medicine

## 2016-08-05 VITALS — BP 122/82 | HR 86 | Temp 97.6°F | Ht 64.0 in | Wt 194.0 lb

## 2016-08-05 DIAGNOSIS — M25551 Pain in right hip: Secondary | ICD-10-CM

## 2016-08-05 DIAGNOSIS — S79911A Unspecified injury of right hip, initial encounter: Secondary | ICD-10-CM | POA: Diagnosis not present

## 2016-08-05 DIAGNOSIS — Z96641 Presence of right artificial hip joint: Secondary | ICD-10-CM | POA: Diagnosis not present

## 2016-08-05 NOTE — Addendum Note (Signed)
Addended by: Reubin Milan on: 08/05/2016 03:43 PM   Modules accepted: Orders

## 2016-08-05 NOTE — Progress Notes (Signed)
Date:  08/05/2016   Name:  Hayley Ewing   DOB:  [birth year]   MRN:  [id number]   Chief Complaint: Knee Pain Knee Pain   The quality of the pain is described as shooting. The pain has been constant since onset. Associated symptoms include an inability to bear weight. Pertinent negatives include no numbness.  Hip Pain   The incident occurred 2 days ago. The incident occurred at home. The injury mechanism was a fall. The pain is present in the right hip. The quality of the pain is described as burning and cramping. The pain is moderate. Associated symptoms include an inability to bear weight. Pertinent negatives include no numbness. She has tried NSAIDs for the symptoms.  She feel in her yard 2 days ago, landing on her right hip.  It is now very sore with tender points on the lateral hip just below her THR incision.    Review of Systems  Constitutional: Negative for chills and fever.  Respiratory: Negative for chest tightness and shortness of breath.   Cardiovascular: Negative for chest pain.  Musculoskeletal: Positive for arthralgias and gait problem. Negative for joint swelling.  Skin: Negative for rash and wound.  Neurological: Negative for tremors, weakness and numbness.  Hematological: Negative for adenopathy.    Patient Active Problem List   Diagnosis Date Noted  . Venous insufficiency of both lower extremities 06/14/2016  . Bladder cystocele 05/19/2015  . Degenerative arthritis of hip 05/19/2015    Prior to Admission medications   Medication Sig Start Date End Date Taking? Authorizing Provider  budesonide-formoterol (SYMBICORT) 80-4.5 MCG/ACT inhaler  11/29/07   Historical Provider, MD  furosemide (LASIX) 20 MG tablet Take 1 tablet (20 mg total) by mouth daily as needed. 06/14/16   Reubin Milan, MD  HYDROcodone-acetaminophen [facility]) 10-325 MG tablet  10/27/15   Historical Provider, MD  VENTOLIN HFA 108 (90 Base) MCG/ACT inhaler Inhale 1 puff into the lungs every 4 (four)  hours as needed. 11/15/15   Historical Provider, MD    No Known Allergies  Past Surgical History:  Procedure Laterality Date  . TOTAL ABDOMINAL HYSTERECTOMY  1998  . TOTAL HIP ARTHROPLASTY Right 2014    Social History  Substance Use Topics  . Smoking status: Never Smoker  . Smokeless tobacco: Not on file  . Alcohol use 1.2 oz/week    2 Standard drinks or equivalent per week     Medication list has been reviewed and updated.   Physical Exam  Constitutional: She is oriented to person, place, and time. She appears well-developed. No distress.  HENT:  Head: Normocephalic and atraumatic.  Pulmonary/Chest: Effort normal. No respiratory distress.  Musculoskeletal:       Right hip: She exhibits decreased range of motion and tenderness (lateral hip). She exhibits no crepitus and no deformity.       Right knee: She exhibits normal range of motion, no swelling and no effusion. No tenderness found.       Lumbar back: She exhibits no tenderness and no spasm.  Neurological: She is alert and oriented to person, place, and time.  Skin: Skin is warm and dry. No rash noted.  Psychiatric: She has a normal mood and affect. Her behavior is normal. Thought content normal.  Nursing note and vitals reviewed.   BP 122/82   Pulse 86   Temp 97.6 F (36.4 C)   Ht 5\' 4"  (1.626 m)   Wt 194 lb (88 kg)   SpO2 98%  BMI 33.30 kg/m   Assessment and Plan: 1. Hip pain, acute, right Aleve bid Continue hydrocodone as needed Use ice or heat - DG HIP UNILAT WITH PELVIS MIN 4 VIEWS RIGHT; Future   Bari Edward, MD [facility]  08/05/2016

## 2016-08-05 NOTE — Patient Instructions (Signed)
Aleve twice a day Continue pain medication Use ice or heat to lateral hip

## 2016-08-23 DIAGNOSIS — G894 Chronic pain syndrome: Secondary | ICD-10-CM | POA: Diagnosis not present

## 2016-08-23 DIAGNOSIS — Z79891 Long term (current) use of opiate analgesic: Secondary | ICD-10-CM | POA: Diagnosis not present

## 2016-08-27 DIAGNOSIS — G894 Chronic pain syndrome: Secondary | ICD-10-CM | POA: Diagnosis not present

## 2016-09-20 DIAGNOSIS — G894 Chronic pain syndrome: Secondary | ICD-10-CM | POA: Diagnosis not present

## 2016-09-30 DIAGNOSIS — J209 Acute bronchitis, unspecified: Secondary | ICD-10-CM | POA: Diagnosis not present

## 2016-10-12 ENCOUNTER — Ambulatory Visit (INDEPENDENT_AMBULATORY_CARE_PROVIDER_SITE_OTHER): Payer: Medicare Other | Admitting: Internal Medicine

## 2016-10-12 ENCOUNTER — Encounter: Payer: Self-pay | Admitting: Internal Medicine

## 2016-10-12 VITALS — BP 132/78 | HR 88 | Temp 98.7°F | Ht 64.0 in | Wt 195.0 lb

## 2016-10-12 DIAGNOSIS — J40 Bronchitis, not specified as acute or chronic: Secondary | ICD-10-CM | POA: Diagnosis not present

## 2016-10-12 MED ORDER — DOXYCYCLINE HYCLATE 100 MG PO TABS: 100.0000 mg | ORAL_TABLET | Freq: Two times a day (BID) | ORAL | 0 refills | Status: DC

## 2016-10-12 MED ORDER — BENZONATATE 100 MG PO CAPS: 100.0000 mg | ORAL_CAPSULE | Freq: Three times a day (TID) | ORAL | 0 refills | Status: DC

## 2016-10-12 MED ORDER — PREDNISONE 10 MG PO TABS: ORAL_TABLET | ORAL | 0 refills | Status: DC

## 2016-10-12 NOTE — Progress Notes (Signed)
Date:  10/12/2016   Name:  Hayley Ewing   DOB:  04-20-1946   MRN:  [id number]   Chief Complaint: Cough (No production. "Hacking cough/deep". Got better then got worse again. Worse at night. No fever. ) Cough  This is a recurrent problem. The problem has been waxing and waning. The problem occurs every few minutes. The cough is non-productive. Associated symptoms include wheezing. Pertinent negatives include no chest pain, chills, ear pain, fever, sore throat or shortness of breath. Treatments tried: has prednisone and Zpak from [facility]. The treatment provided moderate relief.  She improved then began to have tightness in chest and dry hacking cough.  She is using albuterol inhaler intermittently with benefit.    Review of Systems  Constitutional: Negative for chills, diaphoresis and fever.  HENT: Positive for sinus pressure. Negative for ear pain and sore throat.   Eyes: Negative for visual disturbance.  Respiratory: Positive for cough, chest tightness and wheezing. Negative for shortness of breath.   Cardiovascular: Negative for chest pain and palpitations.  Gastrointestinal: Negative for diarrhea, nausea and vomiting.    Patient Active Problem List   Diagnosis Date Noted  . Venous insufficiency of both lower extremities 06/14/2016  . Bladder cystocele 05/19/2015  . Degenerative arthritis of hip 05/19/2015    Prior to Admission medications   Medication Sig Start Date End Date Taking? Authorizing Provider  budesonide-formoterol (SYMBICORT) 80-4.5 MCG/ACT inhaler  11/29/07  Yes Historical Provider, MD  CHERATUSSIN AC 100-10 MG/5ML syrup Take 10 mLs by mouth as needed. 09/30/16  Yes Historical Provider, MD  HYDROcodone-acetaminophen (NORCO) 10-325 MG tablet Take 1 tablet by mouth 2 (two) times daily.  10/27/15  Yes Historical Provider, MD    No Known Allergies  Past Surgical History:  Procedure Laterality Date  . TOTAL ABDOMINAL HYSTERECTOMY  1998  . TOTAL HIP ARTHROPLASTY Right 2014      Social History  Substance Use Topics  . Smoking status: Never Smoker  . Smokeless tobacco: Never Used  . Alcohol use 1.2 oz/week    2 Standard drinks or equivalent per week     Medication list has been reviewed and updated.   Physical Exam  Constitutional: She is oriented to person, place, and time. She appears well-developed. She has a sickly appearance. No distress.  HENT:  Head: Normocephalic and atraumatic.  Cardiovascular: Normal rate, regular rhythm and normal heart sounds.   Pulmonary/Chest: Effort normal. No respiratory distress. She has decreased breath sounds. She has wheezes. She has no rhonchi.  Musculoskeletal: Normal range of motion.  Neurological: She is alert and oriented to person, place, and time.  Skin: Skin is warm and dry. No rash noted.  Psychiatric: She has a normal mood and affect. Her behavior is normal. Thought content normal.  Nursing note and vitals reviewed.   BP 132/78 (BP Location: Right Arm, Patient Position: Sitting, Cuff Size: Normal)   Pulse 88   Temp 98.7 F (37.1 C)   Ht 5\' 4"  (1.626 m)   Wt 195 lb (88.5 kg)   SpO2 100%   BMI 33.47 kg/m   Assessment and Plan: 1. Bronchitis Recurrent with mild bronchospasm Add tessalon perles Continue Robitussin with codeine at night   Meds ordered this encounter  Medications  . predniSONE (DELTASONE) 10 MG tablet    Sig: Take 6 on day 1, 5 on day 2, 4 on day 3, 3 on day 4, 2 on day 5 and 1 on day 1 then stop.    Dispense:  21 tablet    Refill:  0  . doxycycline (VIBRA-TABS) 100 MG tablet    Sig: Take 1 tablet (100 mg total) by mouth 2 (two) times daily.    Dispense:  20 tablet    Refill:  0  . benzonatate (TESSALON) 100 MG capsule    Sig: Take 1 capsule (100 mg total) by mouth 3 (three) times daily.    Dispense:  30 capsule    Refill:  0    Bari Edward, MD [facility]  10/12/2016

## 2016-10-12 NOTE — Patient Instructions (Signed)
Use inhaler 2 puffs every 4-6 hours

## 2016-11-01 ENCOUNTER — Encounter: Payer: Self-pay | Admitting: Internal Medicine

## 2016-11-01 ENCOUNTER — Ambulatory Visit (INDEPENDENT_AMBULATORY_CARE_PROVIDER_SITE_OTHER): Payer: Medicare Other | Admitting: Internal Medicine

## 2016-11-01 VITALS — BP 138/80 | HR 84 | Ht 64.0 in | Wt 192.2 lb

## 2016-11-01 DIAGNOSIS — Z1159 Encounter for screening for other viral diseases: Secondary | ICD-10-CM

## 2016-11-01 DIAGNOSIS — Z23 Encounter for immunization: Secondary | ICD-10-CM

## 2016-11-01 DIAGNOSIS — Z0000 Encounter for general adult medical examination without abnormal findings: Secondary | ICD-10-CM

## 2016-11-01 DIAGNOSIS — Z1231 Encounter for screening mammogram for malignant neoplasm of breast: Secondary | ICD-10-CM

## 2016-11-01 DIAGNOSIS — E782 Mixed hyperlipidemia: Secondary | ICD-10-CM | POA: Diagnosis not present

## 2016-11-01 DIAGNOSIS — D649 Anemia, unspecified: Secondary | ICD-10-CM | POA: Diagnosis not present

## 2016-11-01 LAB — POCT URINALYSIS DIPSTICK
BILIRUBIN UA: NEGATIVE
GLUCOSE UA: NEGATIVE
KETONES UA: NEGATIVE
Nitrite, UA: POSITIVE
Protein, UA: NEGATIVE
SPEC GRAV UA: 1.01 (ref 1.030–1.035)
Urobilinogen, UA: 0.2 (ref ?–2.0)
pH, UA: 5 (ref 5.0–8.0)

## 2016-11-01 MED ORDER — SULFAMETHOXAZOLE-TRIMETHOPRIM 800-160 MG PO TABS: 1.0000 | ORAL_TABLET | Freq: Two times a day (BID) | ORAL | 0 refills | Status: DC

## 2016-11-01 NOTE — Progress Notes (Signed)
Patient: Hayley Ewing, Female    DOB: 03-01-1946, 71 y.o.   MRN: [id number] Visit Date: 11/01/2016  Today's Provider: Bari Edward, MD   Chief Complaint  Patient presents with  . Medicare Wellness   Subjective:    Annual wellness visit Hayley Ewing is a 71 y.o. female who presents today for her Subsequent Annual Wellness Visit. She feels fairly well. She reports exercising none but working in [employer]. She reports she is sleeping fairly well. She denies breast complaints.  Needs to schedule mammogram. She has fully recovered from bronchitis - breathing well with no need for inhaler or cough syrup. ----------------------------------------------------------- Anemia  Presents for follow-up visit. There has been no abdominal pain, bruising/bleeding easily, fever, light-headedness or palpitations. Signs of blood loss that are not present include vaginal bleeding.  Hyperlipidemia  This is a chronic problem. Recent lipid tests were reviewed and are variable. Pertinent negatives include no chest pain or shortness of breath. She is currently on no antihyperlipidemic treatment.   Lab Results  Component Value Date   HGB 11.3 (A) 03/14/2013    Review of Systems  Constitutional: Negative for chills, fatigue and fever.  HENT: Negative for congestion, hearing loss, tinnitus, trouble swallowing and voice change.   Eyes: Negative for visual disturbance.  Respiratory: Negative for cough, chest tightness, shortness of breath and wheezing.   Cardiovascular: Negative for chest pain, palpitations and leg swelling.  Gastrointestinal: Negative for abdominal pain, constipation, diarrhea and vomiting.  Endocrine: Negative for polydipsia and polyuria.  Genitourinary: Negative for dysuria, frequency, genital sores, urgency, vaginal bleeding and vaginal discharge.  Musculoskeletal: Negative for arthralgias, gait problem and joint swelling.  Skin: Negative for color change and rash.    Neurological: Negative for dizziness, tremors, light-headedness and headaches.  Hematological: Negative for adenopathy. Does not bruise/bleed easily.  Psychiatric/Behavioral: Negative for dysphoric mood and sleep disturbance. The patient is not nervous/anxious.     Social History   Social History  . Marital status: Married    Spouse name: N/A  . Number of children: N/A  . Years of education: N/A   Occupational History  . Not on file.   Social History Main Topics  . Smoking status: Never Smoker  . Smokeless tobacco: Never Used  . Alcohol use 1.2 oz/week    2 Standard drinks or equivalent per week  . Drug use: No  . Sexual activity: Not on file   Other Topics Concern  . Not on file   Social History Narrative  . No narrative on file    Patient Active Problem List   Diagnosis Date Noted  . Venous insufficiency of both lower extremities 06/14/2016  . Bladder cystocele 05/19/2015  . Degenerative arthritis of hip 05/19/2015    Past Surgical History:  Procedure Laterality Date  . TOTAL ABDOMINAL HYSTERECTOMY  1998  . TOTAL HIP ARTHROPLASTY Right 2014    Her family history includes Brain cancer in her mother.     Previous Medications   BUDESONIDE-FORMOTEROL (SYMBICORT) 80-4.5 MCG/ACT INHALER       HYDROCODONE-ACETAMINOPHEN (NORCO) 10-325 MG TABLET    Take 1 tablet by mouth 2 (two) times daily.     Patient Care Team: Reubin Milan, MD as PCP - General (Family Medicine) Jimmy J Jordan, MD as Referring Physician (Physical Medicine and Rehabilitation) Fransisca Kaufmann, MD as Referring Physician (Orthopedic Surgery)      Objective:   Vitals: BP 138/80 (BP Location: Right Arm, Patient Position: Sitting, Cuff Size: Normal)   Pulse 84  Ht 5\' 4"  (1.626 m)   Wt 192 lb 3.2 oz (87.2 kg)   SpO2 96%   BMI 32.99 kg/m   Physical Exam  Constitutional: She is oriented to person, place, and time. She appears well-developed and well-nourished. No distress.  HENT:  Head:  Normocephalic and atraumatic.  Right Ear: Tympanic membrane and ear canal normal.  Left Ear: Tympanic membrane and ear canal normal.  Nose: Right sinus exhibits no maxillary sinus tenderness. Left sinus exhibits no maxillary sinus tenderness.  Mouth/Throat: Uvula is midline and oropharynx is clear and moist.  Eyes: Conjunctivae and EOM are normal. Right eye exhibits no discharge. Left eye exhibits no discharge. No scleral icterus.  Neck: Normal range of motion. Carotid bruit is not present. No erythema present. No thyromegaly present.  Cardiovascular: Normal rate, regular rhythm, normal heart sounds and normal pulses.   Pulmonary/Chest: Effort normal. No respiratory distress. She has no wheezes. Right breast exhibits no mass, no nipple discharge, no skin change and no tenderness. Left breast exhibits no mass, no nipple discharge, no skin change and no tenderness.  Abdominal: Soft. Bowel sounds are normal. There is no hepatosplenomegaly. There is no tenderness. There is no CVA tenderness.  Musculoskeletal: Normal range of motion.  Lymphadenopathy:    She has no cervical adenopathy.    She has no axillary adenopathy.  Neurological: She is alert and oriented to person, place, and time. She has normal reflexes. No cranial nerve deficit or sensory deficit.  Skin: Skin is warm, dry and intact. No rash noted.  Psychiatric: She has a normal mood and affect. Her speech is normal and behavior is normal. Thought content normal.  Nursing note and vitals reviewed.   Activities of Daily Living In your present state of health, do you have any difficulty performing the following activities: 11/01/2016  Hearing? N  Vision? N  Difficulty concentrating or making decisions? N  Walking or climbing stairs? N  Dressing or bathing? N  Doing errands, shopping? N  Preparing Food and eating ? N  Using the Toilet? N  In the past six months, have you accidently leaked urine? N  Do you have problems with loss of  bowel control? N  Managing your Medications? N  Managing your Finances? N  Housekeeping or managing your Housekeeping? N  Some recent data might be hidden    Fall Risk Assessment Fall Risk  11/01/2016 10/12/2016 03/21/2016  Falls in the past year? Yes Yes Yes  Number falls in past yr: 1 2 or more 1  Injury with Fall? No Yes Yes  Risk Factor Category  - High Fall Risk -      Depression Screen PHQ 2/9 Scores 11/01/2016 10/12/2016  PHQ - 2 Score 0 0   6CIT Screen 11/01/2016  What Year? 0 points  What month? 0 points  What time? 0 points  Count back from 20 0 points  Months in reverse 4 points  Repeat phrase 0 points  Total Score 4     Medicare Annual Wellness Visit Summary:  Reviewed patient's Family Medical History Reviewed and updated list of patient's medical providers Assessment of cognitive impairment was done Assessed patient's functional ability Established a written schedule for health screening services Health Risk Assessent Completed and Reviewed  Exercise Activities and Dietary recommendations Goals    None      Immunization History  Administered Date(s) Administered  . Tdap 04/30/2008    Health Maintenance  Topic Date Due  . Hepatitis C Screening  [birth year]  .  COLONOSCOPY  10/17/1995  . DEXA SCAN  10/17/2010  . PNA vac Low Risk Adult (1 of 2 - PCV13) 10/17/2010  . MAMMOGRAM  05/26/2016  . INFLUENZA VACCINE  02/22/2017  . TETANUS/TDAP  04/30/2018    Discussed health benefits of physical activity, and encouraged her to engage in regular exercise appropriate for her age and condition.    ------------------------------------------------------------------------------------------------------------  Assessment & Plan:   1. Medicare annual wellness visit, subsequent Measures satisfied Evidence of UA (asx) - will treat - POCT urinalysis dipstick  2. Encounter for screening mammogram for breast cancer - MM DIGITAL SCREENING BILATERAL  3. Need for  hepatitis C screening test - Hepatitis C antibody  4. Need for pneumococcal vaccination - Pneumococcal conjugate vaccine 13-valent IM  5. Mixed hyperlipidemia Continue to work on healthy diet - Comprehensive metabolic panel - Lipid panel - TSH  6. Anemia, unspecified type Recheck for stability - CBC with Differential/Platelet   Meds ordered this encounter  Medications  . sulfamethoxazole-trimethoprim (BACTRIM DS,SEPTRA DS) 800-160 MG tablet    Sig: Take 1 tablet by mouth 2 (two) times daily.    Dispense:  14 tablet    Refill:  0    Bari Edward, MD [facility]  11/01/2016

## 2016-11-01 NOTE — Patient Instructions (Signed)
Health Maintenance  Topic Date Due  . Hepatitis C Screening  [birth year]  . COLONOSCOPY  10/17/1995  . DEXA SCAN  10/17/2010  . PNA vac Low Risk Adult (1 of 2 - PCV13) 10/17/2010  . MAMMOGRAM  05/26/2016  . INFLUENZA VACCINE  02/22/2017  . TETANUS/TDAP  04/30/2018    Breast Self-Awareness Breast self-awareness means being familiar with how your breasts look and feel. It involves checking your breasts regularly and reporting any changes to your health care provider. Practicing breast self-awareness is important. A change in your breasts can be a sign of a serious medical problem. Being familiar with how your breasts look and feel allows you to find any problems early, when treatment is more likely to be successful. All women should practice breast self-awareness, including women who have had breast implants. How to do a breast self-exam One way to learn what is normal for your breasts and whether your breasts are changing is to do a breast self-exam. To do a breast self-exam: Look for Changes   1. Remove all the clothing above your waist. 2. Stand in front of a mirror in a room with good lighting. 3. Put your hands on your hips. 4. Push your hands firmly downward. 5. Compare your breasts in the mirror. Look for differences between them (asymmetry), such as:  Differences in shape.  Differences in size.  Puckers, dips, and bumps in one breast and not the other. 6. Look at each breast for changes in your skin, such as:  Redness.  Scaly areas. 7. Look for changes in your nipples, such as:  Discharge.  Bleeding.  Dimpling.  Redness.  A change in position. Feel for Changes   Carefully feel your breasts for lumps and changes. It is best to do this while lying on your back on the floor and again while sitting or standing in the shower or tub with soapy water on your skin. Feel each breast in the following way:  Place the arm on the side of the breast you are examining above  your head.  Feel your breast with the other hand.  Start in the nipple area and make  inch (2 cm) overlapping circles to feel your breast. Use the pads of your three middle fingers to do this. Apply light pressure, then medium pressure, then firm pressure. The light pressure will allow you to feel the tissue closest to the skin. The medium pressure will allow you to feel the tissue that is a little deeper. The firm pressure will allow you to feel the tissue close to the ribs.  Continue the overlapping circles, moving downward over the breast until you feel your ribs below your breast.  Move one finger-width toward the center of the body. Continue to use the  inch (2 cm) overlapping circles to feel your breast as you move slowly up toward your collarbone.  Continue the up and down exam using all three pressures until you reach your armpit. Write Down What You Find   Write down what is normal for each breast and any changes that you find. Keep a written record with breast changes or normal findings for each breast. By writing this information down, you do not need to depend only on memory for size, tenderness, or location. Write down where you are in your menstrual cycle, if you are still menstruating. If you are having trouble noticing differences in your breasts, do not get discouraged. With time you will become more familiar with the  variations in your breasts and more comfortable with the exam. How often should I examine my breasts? Examine your breasts every month. If you are breastfeeding, the best time to examine your breasts is after a feeding or after using a breast pump. If you menstruate, the best time to examine your breasts is 5-7 days after your period is over. During your period, your breasts are lumpier, and it may be more difficult to notice changes. When should I see my health care provider? See your health care provider if you notice:  A change in shape or size of your breasts  or nipples.  A change in the skin of your breast or nipples, such as a reddened or scaly area.  Unusual discharge from your nipples.  A lump or thick area that was not there before.  Pain in your breasts.  Anything that concerns you. This information is not intended to replace advice given to you by your health care provider. Make sure you discuss any questions you have with your health care provider. Document Released: 07/11/2005 Document Revised: 12/17/2015 Document Reviewed: 05/31/2015 Elsevier Interactive Patient Education  2017 [employer].

## 2016-11-02 LAB — CBC WITH DIFFERENTIAL/PLATELET
BASOS: 1 %
Basophils Absolute: 0 10*3/uL (ref 0.0–0.2)
EOS (ABSOLUTE): 0.2 10*3/uL (ref 0.0–0.4)
Eos: 4 %
Hematocrit: 34.5 % (ref 34.0–46.6)
Hemoglobin: 10.8 g/dL — ABNORMAL LOW (ref 11.1–15.9)
IMMATURE GRANS (ABS): 0 10*3/uL (ref 0.0–0.1)
Immature Granulocytes: 0 %
LYMPHS: 38 %
Lymphocytes Absolute: 1.8 10*3/uL (ref 0.7–3.1)
MCH: 23.2 pg — AB (ref 26.6–33.0)
MCHC: 31.3 g/dL — ABNORMAL LOW (ref 31.5–35.7)
MCV: 74 fL — AB (ref 79–97)
MONOS ABS: 0.4 10*3/uL (ref 0.1–0.9)
Monocytes: 8 %
NEUTROS ABS: 2.4 10*3/uL (ref 1.4–7.0)
Neutrophils: 49 %
PLATELETS: 274 10*3/uL (ref 150–379)
RBC: 4.66 x10E6/uL (ref 3.77–5.28)
RDW: 17.4 % — ABNORMAL HIGH (ref 12.3–15.4)
WBC: 4.7 10*3/uL (ref 3.4–10.8)

## 2016-11-02 LAB — LIPID PANEL
CHOL/HDL RATIO: 2.4 ratio (ref 0.0–4.4)
Cholesterol, Total: 234 mg/dL — ABNORMAL HIGH (ref 100–199)
HDL: 97 mg/dL (ref >39)
LDL Calculated: 116 mg/dL — ABNORMAL HIGH (ref 0–99)
TRIGLYCERIDES: 105 mg/dL (ref 0–149)
VLDL Cholesterol Cal: 21 mg/dL (ref 5–40)

## 2016-11-02 LAB — COMPREHENSIVE METABOLIC PANEL
A/G RATIO: 2 (ref 1.2–2.2)
ALT: 19 IU/L (ref 0–32)
AST: 19 IU/L (ref 0–40)
Albumin: 4.3 g/dL (ref 3.5–4.8)
Alkaline Phosphatase: 76 IU/L (ref 39–117)
BILIRUBIN TOTAL: 0.3 mg/dL (ref 0.0–1.2)
BUN / CREAT RATIO: 18 (ref 12–28)
BUN: 14 mg/dL (ref 8–27)
CHLORIDE: 103 mmol/L (ref 96–106)
CO2: 25 mmol/L (ref 18–29)
Calcium: 9 mg/dL (ref 8.7–10.3)
Creatinine, Ser: 0.76 mg/dL (ref 0.57–1.00)
GFR calc non Af Amer: 79 mL/min/{1.73_m2} (ref >59)
GFR, EST AFRICAN AMERICAN: 91 mL/min/{1.73_m2} (ref >59)
Globulin, Total: 2.2 g/dL (ref 1.5–4.5)
Glucose: 94 mg/dL (ref 65–99)
POTASSIUM: 4.7 mmol/L (ref 3.5–5.2)
Sodium: 142 mmol/L (ref 134–144)
TOTAL PROTEIN: 6.5 g/dL (ref 6.0–8.5)

## 2016-11-02 LAB — TSH: TSH: 0.757 u[IU]/mL (ref 0.450–4.500)

## 2016-11-02 LAB — HEPATITIS C ANTIBODY

## 2016-11-04 NOTE — Progress Notes (Signed)
Tried contacting pt multiple times. Cannot get in contact with her. "Mailbox is full and cannot receive msgs at this time." Awaiting pt call back.

## 2016-12-06 ENCOUNTER — Ambulatory Visit (INDEPENDENT_AMBULATORY_CARE_PROVIDER_SITE_OTHER): Payer: Medicare Other | Admitting: Internal Medicine

## 2016-12-06 ENCOUNTER — Encounter: Payer: Self-pay | Admitting: Internal Medicine

## 2016-12-06 VITALS — BP 138/80 | HR 88 | Temp 97.9°F | Ht 64.0 in | Wt 194.4 lb

## 2016-12-06 DIAGNOSIS — J4521 Mild intermittent asthma with (acute) exacerbation: Secondary | ICD-10-CM | POA: Diagnosis not present

## 2016-12-06 DIAGNOSIS — J40 Bronchitis, not specified as acute or chronic: Secondary | ICD-10-CM | POA: Diagnosis not present

## 2016-12-06 MED ORDER — DOXYCYCLINE HYCLATE 100 MG PO TABS: 100.0000 mg | ORAL_TABLET | Freq: Two times a day (BID) | ORAL | 0 refills | Status: AC

## 2016-12-06 MED ORDER — PREDNISONE 10 MG PO TABS: ORAL_TABLET | ORAL | 0 refills | Status: DC

## 2016-12-06 MED ORDER — HYDROCOD POLST-CPM POLST ER 10-8 MG/5ML PO SUER: 5.0000 mL | Freq: Two times a day (BID) | ORAL | 0 refills | Status: DC

## 2016-12-06 MED ORDER — BUDESONIDE-FORMOTEROL FUMARATE 80-4.5 MCG/ACT IN AERO: 2.0000 | INHALATION_SPRAY | Freq: Every day | RESPIRATORY_TRACT | 12 refills | Status: DC

## 2016-12-06 NOTE — Progress Notes (Signed)
Date:  12/06/2016   Name:  Hayley Ewing   DOB:  28-Oct-1945   MRN:  [id number]   Chief Complaint: Cough (Started feeling bad yesterday. Feeling worse today. Coughing with no production. Feels SOB . Feels like its all in chest.)  Cough  This is a new problem. The current episode started yesterday. The problem has been waxing and waning. The problem occurs every few minutes. The cough is non-productive. Associated symptoms include shortness of breath and wheezing. Pertinent negatives include no chest pain, fever or headaches. She has tried a beta-agonist inhaler for the symptoms. The treatment provided mild relief. Her past medical history is significant for asthma. There is no history of COPD.     Review of Systems  Constitutional: Negative for fever.  Respiratory: Positive for cough, shortness of breath and wheezing. Negative for chest tightness.   Cardiovascular: Negative for chest pain and palpitations.  Gastrointestinal: Negative for nausea and vomiting.  Neurological: Negative for dizziness, light-headedness and headaches.    Patient Active Problem List   Diagnosis Date Noted  . Venous insufficiency of both lower extremities 06/14/2016  . Bladder cystocele 05/19/2015  . Degenerative arthritis of hip 05/19/2015    Prior to Admission medications   Medication Sig Start Date End Date Taking? Authorizing Provider  budesonide-formoterol (SYMBICORT) 80-4.5 MCG/ACT inhaler  11/29/07  Yes [provider]  HYDROcodone-acetaminophen (NORCO) 10-325 MG tablet Take 1 tablet by mouth 2 (two) times daily.  10/27/15  Yes [provider]  sulfamethoxazole-trimethoprim (BACTRIM DS,SEPTRA DS) 800-160 MG tablet Take 1 tablet by mouth 2 (two) times daily. 11/01/16  Yes Reubin Milan, MD    No Known Allergies  Past Surgical History:  Procedure Laterality Date  . TOTAL ABDOMINAL HYSTERECTOMY  1998  . TOTAL HIP ARTHROPLASTY Right 2014    Social History  Substance Use Topics   . Smoking status: Never Smoker  . Smokeless tobacco: Never Used  . Alcohol use 1.2 oz/week    2 Standard drinks or equivalent per week     Medication list has been reviewed and updated.   Physical Exam  Constitutional: She is oriented to person, place, and time. She appears well-developed. She has a sickly appearance. No distress.  HENT:  Head: Normocephalic and atraumatic.  Cardiovascular: Normal rate, regular rhythm and normal heart sounds.   Pulmonary/Chest: Effort normal. No accessory muscle usage. No respiratory distress. She has wheezes. She has no rhonchi.  Musculoskeletal: Normal range of motion.  Neurological: She is alert and oriented to person, place, and time.  Skin: Skin is warm and dry. No rash noted.  Psychiatric: She has a normal mood and affect. Her speech is normal and behavior is normal. Thought content normal.  Nursing note and vitals reviewed.   BP 138/80   Pulse 88   Temp 97.9 F (36.6 C)   Ht 5\' 4"  (1.626 m)   Wt 194 lb 6.4 oz (88.2 kg)   SpO2 97%   BMI 33.37 kg/m   Assessment and Plan: 1. Bronchitis - doxycycline (VIBRA-TABS) 100 MG tablet; Take 1 tablet (100 mg total) by mouth 2 (two) times daily.  Dispense: 20 tablet; Refill: 0 - predniSONE (DELTASONE) 10 MG tablet; Take 6 on day 1, 5 on day 2, 4 on day 3, 3 on day 4, 2 on day 5 and 1 on day 1 then stop.  Dispense: 21 tablet; Refill: 0 - budesonide-formoterol (SYMBICORT) 80-4.5 MCG/ACT inhaler; Inhale 2 puffs into the lungs daily.  Dispense: 1 Inhaler;  Refill: 12  2. Asthma exacerbation, non-allergic, mild intermittent Resume symbicort daily Follow up if no improvement    Meds ordered this encounter  Medications  . doxycycline (VIBRA-TABS) 100 MG tablet    Sig: Take 1 tablet (100 mg total) by mouth 2 (two) times daily.    Dispense:  20 tablet    Refill:  0  . predniSONE (DELTASONE) 10 MG tablet    Sig: Take 6 on day 1, 5 on day 2, 4 on day 3, 3 on day 4, 2 on day 5 and 1 on day 1 then  stop.    Dispense:  21 tablet    Refill:  0  . budesonide-formoterol (SYMBICORT) 80-4.5 MCG/ACT inhaler    Sig: Inhale 2 puffs into the lungs daily.    Dispense:  1 Inhaler    Refill:  12  . chlorpheniramine-HYDROcodone (TUSSIONEX PENNKINETIC ER) 10-8 MG/5ML SUER    Sig: Take 5 mLs by mouth 2 (two) times daily.    Dispense:  115 mL    Refill:  0    Bari Edward, MD [facility]  12/06/2016

## 2016-12-07 ENCOUNTER — Ambulatory Visit: Payer: Medicare Other | Admitting: Internal Medicine

## 2016-12-13 DIAGNOSIS — G894 Chronic pain syndrome: Secondary | ICD-10-CM | POA: Diagnosis not present

## 2016-12-13 DIAGNOSIS — M791 Myalgia: Secondary | ICD-10-CM | POA: Diagnosis not present

## 2017-01-30 ENCOUNTER — Encounter: Payer: Self-pay | Admitting: Internal Medicine

## 2017-01-30 ENCOUNTER — Ambulatory Visit (INDEPENDENT_AMBULATORY_CARE_PROVIDER_SITE_OTHER): Payer: Medicare Other | Admitting: Internal Medicine

## 2017-01-30 VITALS — BP 132/84 | HR 89 | Ht 64.0 in | Wt 197.0 lb

## 2017-01-30 DIAGNOSIS — M719 Bursopathy, unspecified: Secondary | ICD-10-CM | POA: Insufficient documentation

## 2017-01-30 DIAGNOSIS — I872 Venous insufficiency (chronic) (peripheral): Secondary | ICD-10-CM | POA: Diagnosis not present

## 2017-01-30 DIAGNOSIS — M7550 Bursitis of unspecified shoulder: Secondary | ICD-10-CM

## 2017-01-30 DIAGNOSIS — J4521 Mild intermittent asthma with (acute) exacerbation: Secondary | ICD-10-CM | POA: Diagnosis not present

## 2017-01-30 DIAGNOSIS — M5126 Other intervertebral disc displacement, lumbar region: Secondary | ICD-10-CM | POA: Insufficient documentation

## 2017-01-30 NOTE — Progress Notes (Signed)
    Date:  01/30/2017   Name:  Hayley Ewing   DOB:  01-30-1946   MRN:  [id number]   Chief Complaint: Foot Swelling (Left ankle- swollen x 2 days- props up on stool at work. Not getting anybetter. Not painful or hot to touch. Numbness in the bottem of foot.  ) HPI Swelling in both legs but worse on the left.  The sole of her left is swollen as well and it feels strange to walk.  She has been back and forth to the hospital with her husband over the past 2 weeks and standing longer hours at work. Asthma - much improved since using symbicort daily.  No worsening of cough.  No fever or sputum.  Review of Systems  Constitutional: Negative for chills, fatigue and fever.  Respiratory: Negative for chest tightness, shortness of breath and wheezing.   Cardiovascular: Positive for leg swelling. Negative for chest pain and palpitations.  Musculoskeletal: Negative for arthralgias.  Skin: Negative for rash.    Patient Active Problem List   Diagnosis Date Noted  . Disorder of bursae of shoulder region 01/30/2017  . Displacement of lumbar intervertebral disc without myelopathy 01/30/2017  . Asthma exacerbation, non-allergic, mild intermittent 12/06/2016  . Venous insufficiency of both lower extremities 06/14/2016  . Bladder cystocele 05/19/2015  . Degenerative arthritis of hip 05/19/2015    Prior to Admission medications   Medication Sig Start Date End Date Taking? Authorizing Provider  budesonide-formoterol (SYMBICORT) 80-4.5 MCG/ACT inhaler Inhale 2 puffs into the lungs daily. 12/06/16   Reubin Milan, MD  HYDROcodone-acetaminophen [facility]) 10-325 MG tablet Take 1 tablet by mouth 2 (two) times daily.  10/27/15   [provider]    No Known Allergies  Past Surgical History:  Procedure Laterality Date  . TOTAL ABDOMINAL HYSTERECTOMY  1998  . TOTAL HIP ARTHROPLASTY Right 2014    Social History  Substance Use Topics  . Smoking status: Never Smoker  . Smokeless tobacco: Never Used    . Alcohol use 1.2 oz/week    2 Standard drinks or equivalent per week     Medication list has been reviewed and updated.   Physical Exam  Constitutional: She is oriented to person, place, and time. She appears well-developed. No distress.  HENT:  Head: Normocephalic and atraumatic.  Cardiovascular: Normal rate and normal heart sounds.   Varicose veins of both LEs  Pulmonary/Chest: Effort normal and breath sounds normal. No respiratory distress. She has no wheezes.  Musculoskeletal: She exhibits edema (both legs and feet - ).  Neurological: She is alert and oriented to person, place, and time.  Skin: Skin is warm and dry. No rash noted.  Psychiatric: She has a normal mood and affect. Her behavior is normal. Thought content normal.  Nursing note and vitals reviewed.   BP 132/84   Pulse 89   Ht 5\' 4"  (1.626 m)   Wt 197 lb (89.4 kg)   SpO2 97%   BMI 33.81 kg/m   Assessment and Plan: 1. Venous insufficiency of both lower extremities Compression stockings daily Elevate when sitting  2. Asthma exacerbation, non-allergic, mild intermittent Improved on daily symbicort   No orders of the defined types were placed in this encounter.   , MD [facility]  01/30/2017

## 2017-01-30 NOTE — Patient Instructions (Signed)
Wear compression stockings every day.   Put them on in the morning and remove at bedtime.  Elevate your legs as much as possible - try to get your "toes above your nose".

## 2017-02-24 ENCOUNTER — Telehealth: Payer: Self-pay | Admitting: Internal Medicine

## 2017-02-24 ENCOUNTER — Other Ambulatory Visit: Payer: Self-pay

## 2017-02-24 DIAGNOSIS — J40 Bronchitis, not specified as acute or chronic: Secondary | ICD-10-CM

## 2017-02-24 MED ORDER — BUDESONIDE-FORMOTEROL FUMARATE 80-4.5 MCG/ACT IN AERO: 2.0000 | INHALATION_SPRAY | Freq: Every day | RESPIRATORY_TRACT | 8 refills | Status: DC

## 2017-02-24 NOTE — Telephone Encounter (Signed)
[facility] Please advise.  budesonide-formoterol (SYMBICORT) 80-4.5 MCG/ACT inhaler [[id number]]

## 2017-02-24 NOTE — Telephone Encounter (Signed)
Thank you :)

## 2017-02-24 NOTE — Telephone Encounter (Signed)
Refilled pt inhaler and sent to [facility].

## 2017-02-27 ENCOUNTER — Telehealth: Payer: Self-pay

## 2017-02-27 NOTE — Telephone Encounter (Signed)
Patient called saying she called [facility] about symbicort inhaler. Stated she canceled her Rx sent Friday because she found out she can get 3 month supply for inhalers the same as she can a one month supply. Would like 3 month supply sent to [facility]. Please Advise.

## 2017-02-28 ENCOUNTER — Other Ambulatory Visit: Payer: Self-pay | Admitting: Internal Medicine

## 2017-02-28 DIAGNOSIS — J40 Bronchitis, not specified as acute or chronic: Secondary | ICD-10-CM

## 2017-02-28 MED ORDER — BUDESONIDE-FORMOTEROL FUMARATE 80-4.5 MCG/ACT IN AERO: 2.0000 | INHALATION_SPRAY | Freq: Every day | RESPIRATORY_TRACT | 3 refills | Status: DC

## 2017-02-28 NOTE — Telephone Encounter (Signed)
Done

## 2017-03-03 ENCOUNTER — Other Ambulatory Visit: Payer: Self-pay | Admitting: Internal Medicine

## 2017-03-03 DIAGNOSIS — J40 Bronchitis, not specified as acute or chronic: Secondary | ICD-10-CM

## 2017-03-03 MED ORDER — BUDESONIDE-FORMOTEROL FUMARATE 80-4.5 MCG/ACT IN AERO: 2.0000 | INHALATION_SPRAY | Freq: Two times a day (BID) | RESPIRATORY_TRACT | 3 refills | Status: DC

## 2017-03-03 NOTE — Telephone Encounter (Signed)
Pt states dr must contact [facility] before they can send out medication. Please Advise.

## 2017-03-03 NOTE — Telephone Encounter (Signed)
Prescription re-sent with dose clarification.

## 2017-03-14 ENCOUNTER — Encounter: Payer: Self-pay | Admitting: Internal Medicine

## 2017-03-14 ENCOUNTER — Ambulatory Visit (INDEPENDENT_AMBULATORY_CARE_PROVIDER_SITE_OTHER): Payer: Medicare Other | Admitting: Internal Medicine

## 2017-03-14 VITALS — BP 128/68 | HR 84 | Ht 64.0 in | Wt 194.8 lb

## 2017-03-14 DIAGNOSIS — T63463A Toxic effect of venom of wasps, assault, initial encounter: Secondary | ICD-10-CM | POA: Diagnosis not present

## 2017-03-14 MED ORDER — PREDNISONE 10 MG PO TABS: ORAL_TABLET | ORAL | 0 refills | Status: DC
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
## 2017-03-16 DIAGNOSIS — M791 Myalgia: Secondary | ICD-10-CM | POA: Diagnosis not present

## 2017-03-16 DIAGNOSIS — G894 Chronic pain syndrome: Secondary | ICD-10-CM | POA: Diagnosis not present

## 2017-06-13 DIAGNOSIS — G894 Chronic pain syndrome: Secondary | ICD-10-CM | POA: Diagnosis not present

## 2017-06-19 DIAGNOSIS — J45998 Other asthma: Secondary | ICD-10-CM | POA: Diagnosis not present

## 2017-06-19 DIAGNOSIS — J209 Acute bronchitis, unspecified: Secondary | ICD-10-CM | POA: Diagnosis not present

## 2017-06-22 ENCOUNTER — Ambulatory Visit
Admission: RE | Admit: 2017-06-22 | Discharge: 2017-06-22 | Disposition: A | Discharge status: Home / Self Care | Payer: Medicare Other | Source: Ambulatory Visit | Attending: Internal Medicine | Admitting: Internal Medicine

## 2017-06-22 ENCOUNTER — Encounter: Payer: Self-pay | Admitting: Internal Medicine

## 2017-06-22 ENCOUNTER — Other Ambulatory Visit: Payer: Self-pay | Admitting: Internal Medicine

## 2017-06-22 ENCOUNTER — Ambulatory Visit (INDEPENDENT_AMBULATORY_CARE_PROVIDER_SITE_OTHER): Payer: Medicare Other | Admitting: Internal Medicine

## 2017-06-22 VITALS — BP 142/70 | HR 86 | Ht 64.0 in | Wt 194.0 lb

## 2017-06-22 DIAGNOSIS — J4521 Mild intermittent asthma with (acute) exacerbation: Secondary | ICD-10-CM

## 2017-06-22 DIAGNOSIS — I517 Cardiomegaly: Secondary | ICD-10-CM

## 2017-06-22 DIAGNOSIS — R0602 Shortness of breath: Secondary | ICD-10-CM | POA: Diagnosis not present

## 2017-06-22 NOTE — Progress Notes (Signed)
Date:  06/22/2017   Name:  Hayley Ewing   DOB:  [birth year]   MRN:  [id number]   Chief Complaint: abnormal xray (Was seen at [facility] in [place] on 06/19/2017 for acute asthma and bronchitis. Had chest xray which showed she now has enlarged heart. )  Seen earlier this week at [facility] with asthma exacerbation.  She had a CXR that showed enlarged heart but no other abnormality.  She received prednisone and inhaler.  She feels back to baseline today.  She denies PND or orthopnea, LE edema, chest pain, palpitations.  She continues to work at her [employer]. Last CXR in the system was 2012 and was normal. EKG done at the same time was normal except for LBBB.  Review of Systems  Constitutional: Negative for chills, fatigue and fever.  Respiratory: Negative for cough, chest tightness, shortness of breath and wheezing.   Cardiovascular: Negative for chest pain, palpitations and leg swelling.  Gastrointestinal: Negative for abdominal pain.  Musculoskeletal: Positive for arthralgias.  Neurological: Negative for dizziness and headaches.    Patient Active Problem List   Diagnosis Date Noted  . Disorder of bursae of shoulder region 01/30/2017  . Displacement of lumbar intervertebral disc without myelopathy 01/30/2017  . Asthma exacerbation, non-allergic, mild intermittent 12/06/2016  . Venous insufficiency of both lower extremities 06/14/2016  . Bladder cystocele 05/19/2015  . Degenerative arthritis of hip 05/19/2015    Prior to Admission medications   Medication Sig Start Date End Date Taking? Authorizing Provider  budesonide-formoterol (SYMBICORT) 80-4.5 MCG/ACT inhaler Inhale 2 puffs into the lungs 2 (two) times daily. 03/03/17  Yes Reubin Milan, MD  HYDROcodone-acetaminophen [facility]) 10-325 MG tablet Take 1 tablet by mouth 2 (two) times daily.  10/27/15  Yes [provider]  predniSONE (DELTASONE) 10 MG tablet Take 6 on day 1, 5 on day 2, 4 on day 3, 3 on day 4, 2  on day 5 and 1 on day 1 then stop. 03/14/17  Yes Reubin Milan, MD    No Known Allergies  Past Surgical History:  Procedure Laterality Date  . TOTAL ABDOMINAL HYSTERECTOMY  1998  . TOTAL HIP ARTHROPLASTY Right 2014    Social History   Tobacco Use  . Smoking status: Never Smoker  . Smokeless tobacco: Never Used  Substance Use Topics  . Alcohol use: Yes    Alcohol/week: 1.2 oz    Types: 2 Standard drinks or equivalent per week  . Drug use: No     Medication list has been reviewed and updated.  PHQ 2/9 Scores 11/01/2016 10/12/2016  PHQ - 2 Score 0 0    Physical Exam  Constitutional: She is oriented to person, place, and time. She appears well-developed. No distress.  HENT:  Head: Normocephalic and atraumatic.  Cardiovascular: Normal rate, regular rhythm, S1 normal, normal heart sounds and intact distal pulses. PMI is not displaced. Exam reveals no gallop.  No murmur heard. Pulmonary/Chest: Effort normal and breath sounds normal. No accessory muscle usage. No respiratory distress. She has no wheezes. She has no rhonchi.  Abdominal: Soft. Normal appearance and bowel sounds are normal. There is no tenderness.  Musculoskeletal: She exhibits no edema.  Neurological: She is alert and oriented to person, place, and time.  Skin: Skin is warm and dry. No rash noted.  Psychiatric: She has a normal mood and affect. Her behavior is normal. Thought content normal.  Nursing note and vitals reviewed.   BP (!) 142/70   Pulse 86  Ht 5\' 4"  (1.626 m)   Wt 194 lb (88 kg)   SpO2 97%   BMI 33.30 kg/m   Assessment and Plan: 1. Asthma exacerbation, non-allergic, mild intermittent Finish prednisone therapy If CXR abnormal, will refer to Cardiology - EKG 12-Lead - SR 83, possible LAE, otherwise normal - DG Chest 2 View; Future   No orders of the defined types were placed in this encounter.   Partially dictated using . Any errors are unintentional.  Animal nutritionist, MD [facility]  06/22/2017

## 2017-06-28 ENCOUNTER — Other Ambulatory Visit: Payer: Self-pay | Admitting: Internal Medicine

## 2017-06-28 ENCOUNTER — Ambulatory Visit (INDEPENDENT_AMBULATORY_CARE_PROVIDER_SITE_OTHER): Payer: Medicare Other | Admitting: Internal Medicine

## 2017-06-28 ENCOUNTER — Encounter: Payer: Self-pay | Admitting: Internal Medicine

## 2017-06-28 VITALS — BP 112/78 | HR 95 | Temp 98.1°F | Ht 64.0 in | Wt 200.0 lb

## 2017-06-28 DIAGNOSIS — J4540 Moderate persistent asthma, uncomplicated: Secondary | ICD-10-CM

## 2017-06-28 MED ORDER — AZITHROMYCIN 250 MG PO TABS: ORAL_TABLET | ORAL | 0 refills | Status: DC

## 2017-06-28 MED ORDER — ALBUTEROL SULFATE (2.5 MG/3ML) 0.083% IN NEBU: 2.5000 mg | INHALATION_SOLUTION | Freq: Once | RESPIRATORY_TRACT | Status: AC

## 2017-06-28 MED ORDER — HYDROCODONE-HOMATROPINE 5-1.5 MG/5ML PO SYRP: 5.0000 mL | ORAL_SOLUTION | Freq: Four times a day (QID) | ORAL | 0 refills | Status: DC | PRN

## 2017-06-28 MED ORDER — ALBUTEROL SULFATE (2.5 MG/3ML) 0.083% IN NEBU: 2.5000 mg | INHALATION_SOLUTION | Freq: Four times a day (QID) | RESPIRATORY_TRACT | 12 refills | Status: DC | PRN

## 2017-06-28 MED ORDER — PREDNISONE 10 MG PO TABS: ORAL_TABLET | ORAL | 0 refills | Status: DC

## 2017-06-28 MED ORDER — HYDROCOD POLST-CPM POLST ER 10-8 MG/5ML PO SUER: 5.0000 mL | Freq: Two times a day (BID) | ORAL | 0 refills | Status: DC

## 2017-06-28 NOTE — Progress Notes (Signed)
Date:  06/28/2017   Name:  Hayley Ewing   DOB:  23-May-1946   MRN:  [id number]   Chief Complaint: Follow-up (seen in urgent care on 06/21/17- was given taper of prednisone and symbicort BID but no antibiotic. Having a cough- Delsym doesn't help/ has a white production from cough. Green production from nose. Feels SOB, having to sleep sitting up at night) She has been having severe coughing at night - unable to lie down and having rib pain as well.  Some wheezing and tightness in chest temporarily relieved by inhalers. She denies headache, vomiting, colored sputum production.   Review of Systems  Constitutional: Positive for fatigue. Negative for chills, fever and unexpected weight change.  Respiratory: Positive for cough, chest tightness, shortness of breath and wheezing.   Cardiovascular: Negative for chest pain and palpitations.  Gastrointestinal: Negative for diarrhea, nausea and vomiting.  Neurological: Negative for dizziness and headaches.  Psychiatric/Behavioral: Positive for sleep disturbance.    Patient Active Problem List   Diagnosis Date Noted  . Left ventricular enlargement 06/22/2017  . Disorder of bursae of shoulder region 01/30/2017  . Displacement of lumbar intervertebral disc without myelopathy 01/30/2017  . Asthma exacerbation, non-allergic, mild intermittent 12/06/2016  . Venous insufficiency of both lower extremities 06/14/2016  . Bladder cystocele 05/19/2015  . Degenerative arthritis of hip 05/19/2015    Prior to Admission medications   Medication Sig Start Date End Date Taking? Authorizing Provider  budesonide-formoterol (SYMBICORT) 80-4.5 MCG/ACT inhaler Inhale 2 puffs into the lungs 2 (two) times daily. 03/03/17  Yes Reubin Milan, MD  HYDROcodone-acetaminophen [facility]) 10-325 MG tablet Take 1 tablet by mouth 2 (two) times daily.  10/27/15  Yes [provider]    No Known Allergies  Past Surgical History:  Procedure Laterality Date  . TOTAL  ABDOMINAL HYSTERECTOMY  1998  . TOTAL HIP ARTHROPLASTY Right 2014    Social History   Tobacco Use  . Smoking status: Never Smoker  . Smokeless tobacco: Never Used  Substance Use Topics  . Alcohol use: Yes    Alcohol/week: 1.2 oz    Types: 2 Standard drinks or equivalent per week  . Drug use: No     Medication list has been reviewed and updated.  PHQ 2/9 Scores 11/01/2016 10/12/2016  PHQ - 2 Score 0 0    Physical Exam  Constitutional: She is oriented to person, place, and time. She appears well-developed. She appears distressed (from recurrent cough).  HENT:  Head: Normocephalic and atraumatic.  Cardiovascular: Normal rate, regular rhythm and normal heart sounds.  Pulmonary/Chest: Effort normal. No accessory muscle usage. No respiratory distress. She has decreased breath sounds. She has no wheezes.  Abdominal: Soft. Normal appearance.  Musculoskeletal: Normal range of motion.  Neurological: She is alert and oriented to person, place, and time.  Skin: Skin is warm and dry. No rash noted.  Psychiatric: She has a normal mood and affect. Her speech is normal and behavior is normal. Thought content normal.  Nursing note and vitals reviewed.   BP 112/78   Pulse 95   Temp 98.1 F (36.7 C) (Oral)   Ht 5\' 4"  (1.626 m)   Wt 200 lb (90.7 kg)   SpO2 99%   BMI 34.33 kg/m   Assessment and Plan: 1. Moderate persistent asthma without complication Begin nebulizer at home every 4-6 hours as needed Antibiotics and prednisone taper - albuterol (PROVENTIL) (2.5 MG/3ML) 0.083% nebulizer solution 2.5 mg - HYDROcodone-homatropine (HYCODAN) 5-1.5 MG/5ML syrup; Take 5  mLs by mouth every 6 (six) hours as needed for cough.  Dispense: 120 mL; Refill: 0 - azithromycin (ZITHROMAX Z-PAK) 250 MG tablet; UAD  Dispense: 6 each; Refill: 0 - predniSONE (DELTASONE) 10 MG tablet; Take 6 on day 1and 2, 5 on day 3 and 4, 4 on day 5 and 6 , 3 on day 7 and 8, 2 on day 9 and 10 and 1 on day 11 and 12 then  stop.  Dispense: 42 tablet; Refill: 0 - albuterol (PROVENTIL) (2.5 MG/3ML) 0.083% nebulizer solution; Take 3 mLs (2.5 mg total) by nebulization every 6 (six) hours as needed for wheezing or shortness of breath.  Dispense: 75 mL; Refill: 12   Meds ordered this encounter  Medications  . albuterol (PROVENTIL) (2.5 MG/3ML) 0.083% nebulizer solution 2.5 mg  . HYDROcodone-homatropine (HYCODAN) 5-1.5 MG/5ML syrup    Sig: Take 5 mLs by mouth every 6 (six) hours as needed for cough.    Dispense:  120 mL    Refill:  0  . azithromycin (ZITHROMAX Z-PAK) 250 MG tablet    Sig: UAD    Dispense:  6 each    Refill:  0  . predniSONE (DELTASONE) 10 MG tablet    Sig: Take 6 on day 1and 2, 5 on day 3 and 4, 4 on day 5 and 6 , 3 on day 7 and 8, 2 on day 9 and 10 and 1 on day 11 and 12 then stop.    Dispense:  42 tablet    Refill:  0  . albuterol (PROVENTIL) (2.5 MG/3ML) 0.083% nebulizer solution    Sig: Take 3 mLs (2.5 mg total) by nebulization every 6 (six) hours as needed for wheezing or shortness of breath.    Dispense:  75 mL    Refill:  12    Partially dictated using Animal nutritionist. Any errors are unintentional.  Bari Edward, MD [facility]  06/28/2017

## 2017-07-07 ENCOUNTER — Telehealth: Payer: Self-pay

## 2017-07-07 DIAGNOSIS — M25511 Pain in right shoulder: Secondary | ICD-10-CM | POA: Diagnosis not present

## 2017-07-07 NOTE — Telephone Encounter (Signed)
Spoke to pt- she said no pain, swelling, white patches. Dr Judithann Graves said most likely not thrush then. Informed patient. If not better by next week, come in office to have evaluated.

## 2017-07-07 NOTE — Telephone Encounter (Signed)
Patient stated she has been using a lot of breathing treatments latley and believes its causing her to have thrush. She is not rinsing her mouth after the treatments like she does with inhalers.  Please Advise.

## 2017-07-07 NOTE — Telephone Encounter (Signed)
Nebulizer will not cause thrush.  However, she could have thrush from the antibiotics.

## 2017-07-10 ENCOUNTER — Other Ambulatory Visit: Payer: Self-pay | Admitting: Internal Medicine

## 2017-07-10 ENCOUNTER — Telehealth: Payer: Self-pay | Admitting: Internal Medicine

## 2017-07-10 DIAGNOSIS — J45998 Other asthma: Secondary | ICD-10-CM | POA: Insufficient documentation

## 2017-07-10 DIAGNOSIS — Z8679 Personal history of other diseases of the circulatory system: Secondary | ICD-10-CM | POA: Insufficient documentation

## 2017-07-10 MED ORDER — NYSTATIN 100000 UNIT/ML MT SUSP: 5.0000 mL | Freq: Four times a day (QID) | OROMUCOSAL | 0 refills | Status: DC

## 2017-07-10 NOTE — Telephone Encounter (Signed)
Pt states she has oral thrush, she read online she could use vinegar and baking soda so she did and says it got a little better but is wondering if there is something else she can do. Please advise.

## 2017-07-10 NOTE — Telephone Encounter (Signed)
I can send in Rx for nystatin mouthwash.

## 2017-07-10 NOTE — Telephone Encounter (Signed)
Would like sent [facility] [address]

## 2017-07-13 DIAGNOSIS — I119 Hypertensive heart disease without heart failure: Secondary | ICD-10-CM | POA: Diagnosis not present

## 2017-07-13 DIAGNOSIS — I10 Essential (primary) hypertension: Secondary | ICD-10-CM | POA: Diagnosis not present

## 2017-07-13 DIAGNOSIS — R0602 Shortness of breath: Secondary | ICD-10-CM | POA: Diagnosis not present

## 2017-07-13 DIAGNOSIS — I517 Cardiomegaly: Secondary | ICD-10-CM | POA: Diagnosis not present

## 2017-07-24 DIAGNOSIS — I871 Compression of vein: Secondary | ICD-10-CM | POA: Diagnosis not present

## 2017-07-24 DIAGNOSIS — I340 Nonrheumatic mitral (valve) insufficiency: Secondary | ICD-10-CM | POA: Diagnosis not present

## 2017-07-24 DIAGNOSIS — I517 Cardiomegaly: Secondary | ICD-10-CM | POA: Diagnosis not present

## 2017-07-24 DIAGNOSIS — R931 Abnormal findings on diagnostic imaging of heart and coronary circulation: Secondary | ICD-10-CM | POA: Diagnosis not present

## 2017-07-24 DIAGNOSIS — I119 Hypertensive heart disease without heart failure: Secondary | ICD-10-CM | POA: Diagnosis not present

## 2017-07-24 DIAGNOSIS — R0602 Shortness of breath: Secondary | ICD-10-CM | POA: Diagnosis not present

## 2017-07-24 DIAGNOSIS — I288 Other diseases of pulmonary vessels: Secondary | ICD-10-CM | POA: Diagnosis not present

## 2017-07-28 ENCOUNTER — Other Ambulatory Visit: Payer: Self-pay | Admitting: Internal Medicine

## 2017-07-28 ENCOUNTER — Telehealth: Payer: Self-pay

## 2017-07-28 DIAGNOSIS — I872 Venous insufficiency (chronic) (peripheral): Secondary | ICD-10-CM

## 2017-07-28 NOTE — Telephone Encounter (Signed)
Patient called requesting refill on Lasix medication. Told we have not prescribed this for her so asked where she is getting from. She stated she has old RX from 2017 she got from Dr Hayley Ewing. She started "herself" back on the medication when her legs and ankles were swelling. States she needs 9 more days to get her to her appt with cardiologist 08/10/2017.   Spoke with Dr Hayley Ewing- informed pt Dr Hayley Ewing said this is a dangerous medication to just start back up herself. She needs to be monitored on it. Dr Hayley Ewing said she needs to wait until her appt with cardiologist if he wants her taking it. She verbalized understanding.

## 2017-07-28 NOTE — Telephone Encounter (Signed)
Patient called stating her heart doctor thinks she should be on lasix medication. She was in the past with Dr Judithann Graves.  Asher Muir Called patient back and informed her she would need to get RX from Heart doctor if he would like her to take it. It has been since 09/2016 that she has taken this medicine in our notes.  She verbalized understanding per Asher Muir and will contact heart doctor.

## 2017-08-02 DIAGNOSIS — R0602 Shortness of breath: Secondary | ICD-10-CM | POA: Diagnosis not present

## 2017-08-02 DIAGNOSIS — Z8679 Personal history of other diseases of the circulatory system: Secondary | ICD-10-CM | POA: Diagnosis not present

## 2017-08-10 DIAGNOSIS — I10 Essential (primary) hypertension: Secondary | ICD-10-CM | POA: Insufficient documentation

## 2017-08-10 DIAGNOSIS — I519 Heart disease, unspecified: Secondary | ICD-10-CM | POA: Diagnosis not present

## 2017-08-22 DIAGNOSIS — I10 Essential (primary) hypertension: Secondary | ICD-10-CM | POA: Diagnosis not present

## 2017-08-30 DIAGNOSIS — N12 Tubulo-interstitial nephritis, not specified as acute or chronic: Secondary | ICD-10-CM | POA: Diagnosis not present

## 2017-08-30 DIAGNOSIS — R55 Syncope and collapse: Secondary | ICD-10-CM | POA: Diagnosis not present

## 2017-08-30 DIAGNOSIS — A09 Infectious gastroenteritis and colitis, unspecified: Secondary | ICD-10-CM | POA: Diagnosis not present

## 2017-08-30 DIAGNOSIS — N179 Acute kidney failure, unspecified: Secondary | ICD-10-CM | POA: Diagnosis not present

## 2017-08-30 DIAGNOSIS — D62 Acute posthemorrhagic anemia: Secondary | ICD-10-CM | POA: Diagnosis not present

## 2017-08-30 DIAGNOSIS — I517 Cardiomegaly: Secondary | ICD-10-CM | POA: Diagnosis not present

## 2017-08-30 DIAGNOSIS — K625 Hemorrhage of anus and rectum: Secondary | ICD-10-CM | POA: Diagnosis not present

## 2017-08-30 DIAGNOSIS — A4151 Sepsis due to Escherichia coli [E. coli]: Secondary | ICD-10-CM | POA: Diagnosis not present

## 2017-08-30 DIAGNOSIS — K529 Noninfective gastroenteritis and colitis, unspecified: Secondary | ICD-10-CM | POA: Diagnosis not present

## 2017-08-30 DIAGNOSIS — I130 Hypertensive heart and chronic kidney disease with heart failure and stage 1 through stage 4 chronic kidney disease, or unspecified chronic kidney disease: Secondary | ICD-10-CM | POA: Diagnosis not present

## 2017-08-31 DIAGNOSIS — B9620 Unspecified Escherichia coli [E. coli] as the cause of diseases classified elsewhere: Secondary | ICD-10-CM | POA: Diagnosis present

## 2017-08-31 DIAGNOSIS — R55 Syncope and collapse: Secondary | ICD-10-CM | POA: Diagnosis not present

## 2017-08-31 DIAGNOSIS — Z87442 Personal history of urinary calculi: Secondary | ICD-10-CM | POA: Diagnosis not present

## 2017-08-31 DIAGNOSIS — R197 Diarrhea, unspecified: Secondary | ICD-10-CM | POA: Diagnosis not present

## 2017-08-31 DIAGNOSIS — I110 Hypertensive heart disease with heart failure: Secondary | ICD-10-CM | POA: Diagnosis not present

## 2017-08-31 DIAGNOSIS — K559 Vascular disorder of intestine, unspecified: Secondary | ICD-10-CM | POA: Diagnosis not present

## 2017-08-31 DIAGNOSIS — Z79899 Other long term (current) drug therapy: Secondary | ICD-10-CM | POA: Diagnosis not present

## 2017-08-31 DIAGNOSIS — Z7951 Long term (current) use of inhaled steroids: Secondary | ICD-10-CM | POA: Diagnosis not present

## 2017-08-31 DIAGNOSIS — I130 Hypertensive heart and chronic kidney disease with heart failure and stage 1 through stage 4 chronic kidney disease, or unspecified chronic kidney disease: Secondary | ICD-10-CM | POA: Diagnosis present

## 2017-08-31 DIAGNOSIS — I872 Venous insufficiency (chronic) (peripheral): Secondary | ICD-10-CM | POA: Diagnosis not present

## 2017-08-31 DIAGNOSIS — D509 Iron deficiency anemia, unspecified: Secondary | ICD-10-CM | POA: Diagnosis present

## 2017-08-31 DIAGNOSIS — R931 Abnormal findings on diagnostic imaging of heart and coronary circulation: Secondary | ICD-10-CM | POA: Diagnosis not present

## 2017-08-31 DIAGNOSIS — A09 Infectious gastroenteritis and colitis, unspecified: Secondary | ICD-10-CM | POA: Diagnosis present

## 2017-08-31 DIAGNOSIS — J45909 Unspecified asthma, uncomplicated: Secondary | ICD-10-CM | POA: Diagnosis present

## 2017-08-31 DIAGNOSIS — I5042 Chronic combined systolic (congestive) and diastolic (congestive) heart failure: Secondary | ICD-10-CM | POA: Diagnosis present

## 2017-08-31 DIAGNOSIS — A4151 Sepsis due to Escherichia coli [E. coli]: Secondary | ICD-10-CM | POA: Diagnosis not present

## 2017-08-31 DIAGNOSIS — I519 Heart disease, unspecified: Secondary | ICD-10-CM | POA: Diagnosis not present

## 2017-08-31 DIAGNOSIS — N12 Tubulo-interstitial nephritis, not specified as acute or chronic: Secondary | ICD-10-CM | POA: Diagnosis not present

## 2017-08-31 DIAGNOSIS — K644 Residual hemorrhoidal skin tags: Secondary | ICD-10-CM | POA: Diagnosis present

## 2017-08-31 DIAGNOSIS — K625 Hemorrhage of anus and rectum: Secondary | ICD-10-CM | POA: Diagnosis not present

## 2017-08-31 DIAGNOSIS — N179 Acute kidney failure, unspecified: Secondary | ICD-10-CM | POA: Diagnosis not present

## 2017-08-31 DIAGNOSIS — A419 Sepsis, unspecified organism: Secondary | ICD-10-CM | POA: Diagnosis not present

## 2017-08-31 DIAGNOSIS — I5041 Acute combined systolic (congestive) and diastolic (congestive) heart failure: Secondary | ICD-10-CM | POA: Diagnosis not present

## 2017-08-31 DIAGNOSIS — I517 Cardiomegaly: Secondary | ICD-10-CM | POA: Diagnosis not present

## 2017-08-31 DIAGNOSIS — Z79891 Long term (current) use of opiate analgesic: Secondary | ICD-10-CM | POA: Diagnosis not present

## 2017-08-31 DIAGNOSIS — D62 Acute posthemorrhagic anemia: Secondary | ICD-10-CM | POA: Diagnosis present

## 2017-08-31 DIAGNOSIS — M1990 Unspecified osteoarthritis, unspecified site: Secondary | ICD-10-CM | POA: Diagnosis present

## 2017-08-31 DIAGNOSIS — Z96641 Presence of right artificial hip joint: Secondary | ICD-10-CM | POA: Diagnosis present

## 2017-08-31 DIAGNOSIS — D500 Iron deficiency anemia secondary to blood loss (chronic): Secondary | ICD-10-CM | POA: Diagnosis not present

## 2017-08-31 DIAGNOSIS — R109 Unspecified abdominal pain: Secondary | ICD-10-CM | POA: Diagnosis not present

## 2017-08-31 DIAGNOSIS — G8929 Other chronic pain: Secondary | ICD-10-CM | POA: Diagnosis present

## 2017-08-31 DIAGNOSIS — N10 Acute tubulo-interstitial nephritis: Secondary | ICD-10-CM | POA: Diagnosis not present

## 2017-08-31 DIAGNOSIS — K529 Noninfective gastroenteritis and colitis, unspecified: Secondary | ICD-10-CM | POA: Insufficient documentation

## 2017-08-31 DIAGNOSIS — N183 Chronic kidney disease, stage 3 (moderate): Secondary | ICD-10-CM | POA: Diagnosis present

## 2017-08-31 DIAGNOSIS — I340 Nonrheumatic mitral (valve) insufficiency: Secondary | ICD-10-CM | POA: Diagnosis not present

## 2017-09-11 DIAGNOSIS — I519 Heart disease, unspecified: Secondary | ICD-10-CM | POA: Diagnosis not present

## 2017-09-11 DIAGNOSIS — I10 Essential (primary) hypertension: Secondary | ICD-10-CM | POA: Diagnosis not present

## 2017-09-11 DIAGNOSIS — I872 Venous insufficiency (chronic) (peripheral): Secondary | ICD-10-CM | POA: Diagnosis not present

## 2017-09-14 ENCOUNTER — Other Ambulatory Visit: Payer: Self-pay | Admitting: Internal Medicine

## 2017-09-14 ENCOUNTER — Encounter: Payer: Self-pay | Admitting: Internal Medicine

## 2017-09-14 DIAGNOSIS — G894 Chronic pain syndrome: Secondary | ICD-10-CM | POA: Diagnosis not present

## 2017-09-14 DIAGNOSIS — Z79891 Long term (current) use of opiate analgesic: Secondary | ICD-10-CM | POA: Diagnosis not present

## 2017-09-15 ENCOUNTER — Ambulatory Visit (INDEPENDENT_AMBULATORY_CARE_PROVIDER_SITE_OTHER): Payer: Medicare Other | Admitting: Internal Medicine

## 2017-09-15 ENCOUNTER — Encounter: Payer: Self-pay | Admitting: Internal Medicine

## 2017-09-15 VITALS — BP 98/60 | HR 80 | Ht 64.0 in | Wt 187.0 lb

## 2017-09-15 DIAGNOSIS — Z8679 Personal history of other diseases of the circulatory system: Secondary | ICD-10-CM

## 2017-09-15 DIAGNOSIS — I519 Heart disease, unspecified: Secondary | ICD-10-CM | POA: Diagnosis not present

## 2017-09-15 DIAGNOSIS — D649 Anemia, unspecified: Secondary | ICD-10-CM | POA: Diagnosis not present

## 2017-09-15 DIAGNOSIS — K529 Noninfective gastroenteritis and colitis, unspecified: Secondary | ICD-10-CM

## 2017-09-15 NOTE — Progress Notes (Signed)
Date:  09/15/2017   Name:  Hayley Ewing   DOB:  12/11/1945   MRN:  [id number]   Chief Complaint: Hospitalization Follow-up Follow up from the hospital.  She did not receive a TOC call. She was admitted to [facility] 08/29/17 to 09/04/17 for Discharge Diagnoses:  Colitis, unspecified History of cardiomegaly Left ventricular enlargement Venous insufficiency of both lower extremities Systolic dysfunction Diastolic dysfunction Essential hypertension  Patient presented with abdominal pain, bloody diarrhea, and CT imaging of colitis. Likely infectious based on presentation, patent vessels on CT, and improvement with antibiotics. C. Diff and stool culture negative. Also found to have E. Coli urinary tract infection with findings consistent with pyelonepritis on imaging. -Ciprofloxacin 500 mg BID for 4 additional days -Flagyl 500 mg TID for 4 additional days  Iron deficiency -Consider iron therapy after resolution of likely infectious colitis. Recommended Follow-up Studies: -mammogram to evaluate left breast asymmetry incidentally noted on CT -colonoscopy after resolution of colitis for further evaluation and rule out other causes   Also had ECHO which showed EF 35%. Medications were unchanged and she is to follow up with Cardiology.  She was seen several days ago and was put on beta blocker.  CT abdomen: Scattered atelectasis in the imaged lower lungs. Medial left breast contains 1.2 cm region of asymmetric density (series 5, image 6).  Pt instructed to schedule mammogram.  One has already been ordered.   Review of Systems  Constitutional: Negative for chills and fever.  Respiratory: Negative for cough, chest tightness, shortness of breath and wheezing.   Cardiovascular: Negative for chest pain and palpitations.  Gastrointestinal: Negative for abdominal distention, abdominal pain, anal bleeding, blood in stool, constipation and diarrhea.  Genitourinary: Negative for dysuria.    Patient  Active Problem List   Diagnosis Date Noted  . Colitis 08/31/2017  . Essential hypertension 08/10/2017  . Systolic dysfunction 08/10/2017  . History of cardiomegaly 07/10/2017  . Asthma, persistent controlled 07/10/2017  . Left ventricular enlargement 06/22/2017  . Disorder of bursae of shoulder region 01/30/2017  . Displacement of lumbar intervertebral disc without myelopathy 01/30/2017  . Venous insufficiency of both lower extremities 06/14/2016  . Bladder cystocele 05/19/2015  . Degenerative arthritis of hip 05/19/2015    Prior to Admission medications   Medication Sig Start Date End Date Taking? Authorizing Provider  albuterol (PROVENTIL) (2.5 MG/3ML) 0.083% nebulizer solution Take 3 mLs (2.5 mg total) by nebulization every 6 (six) hours as needed for wheezing or shortness of breath. 06/28/17   Reubin Milan, MD  Metoprolol XL 25 mg One tablet daily 09/12/17   Reubin Milan, MD  budesonide-formoterol [facility]) 80-4.5 MCG/ACT inhaler Inhale 2 puffs into the lungs 2 (two) times daily. 03/03/17   Reubin Milan, MD       Reubin Milan, MD  furosemide (LASIX) 20 MG tablet Take 1 tablet by mouth daily. 08/10/17 08/10/18  [provider]  HYDROcodone-acetaminophen (NORCO) 10-325 MG tablet Take 1 tablet by mouth 2 (two) times daily.  10/27/15   [provider]  lisinopril (PRINIVIL,ZESTRIL) 5 MG tablet Take 1 tablet by mouth daily. 08/10/17 08/10/18  [provider]  nystatin (MYCOSTATIN) 100000 UNIT/ML suspension Take 5 mLs (500,000 Units total) by mouth 4 (four) times daily. Swish, gargle and spit 07/10/17   Reubin Milan, MD  predniSONE (DELTASONE) 10 MG tablet Take 6 on day 1and 2, 5 on day 3 and 4, 4 on day 5 and 6 , 3 on day 7 and  8, 2 on day 9 and 10 and 1 on day 11 and 12 then stop. 06/28/17   Reubin Milan, MD  spironolactone (ALDACTONE) 25 MG tablet Take 0.5 tablets by mouth daily. 09/05/17 09/05/18  [provider]    No Known  Allergies  Past Surgical History:  Procedure Laterality Date  . TOTAL ABDOMINAL HYSTERECTOMY  1998  . TOTAL HIP ARTHROPLASTY Right 2014    Social History   Tobacco Use  . Smoking status: Never Smoker  . Smokeless tobacco: Never Used  Substance Use Topics  . Alcohol use: Yes    Alcohol/week: 1.2 oz    Types: 2 Standard drinks or equivalent per week  . Drug use: No     Medication list has been reviewed and updated.  PHQ 2/9 Scores 11/01/2016 10/12/2016  PHQ - 2 Score 0 0    Physical Exam  Constitutional: She is oriented to person, place, and time. She appears well-developed. No distress.  HENT:  Head: Normocephalic and atraumatic.  Neck: Normal range of motion. Neck supple.  Cardiovascular: Normal rate, regular rhythm and normal heart sounds.  Pulmonary/Chest: Effort normal and breath sounds normal. No respiratory distress. She has no wheezes.  Abdominal: Soft. Bowel sounds are normal. She exhibits no distension. There is no tenderness. There is no rebound.  Musculoskeletal: Normal range of motion. She exhibits no edema.  Neurological: She is alert and oriented to person, place, and time.  Skin: Skin is warm and dry. No rash noted.  Psychiatric: She has a normal mood and affect. Her behavior is normal. Thought content normal.  Nursing note and vitals reviewed.   BP 98/60   Pulse 80   Ht 5\' 4"  (1.626 m)   Wt 187 lb (84.8 kg)   BMI 32.10 kg/m   Assessment and Plan: 1. Colitis Much improved Continue healthy diet Will need Colonoscopy - pt to get name of MD from [facility]  2. Anemia, unspecified type Continue iron rich foods  3. History of cardiomegaly EF 35% Seen by cardiology - continue same medications  4. Systolic dysfunction   No orders of the defined types were placed in this encounter.   Partially dictated using [facility]. Any errors are unintentional.  Animal nutritionist, MD [facility]  09/15/2017

## 2017-09-15 NOTE — Patient Instructions (Signed)
Schedule Mammogram  Return with name of GI MD at [facility]

## 2017-10-19 ENCOUNTER — Ambulatory Visit: Payer: Medicare Other | Admitting: Internal Medicine

## 2017-10-23 ENCOUNTER — Ambulatory Visit: Payer: Medicare Other | Admitting: Internal Medicine

## 2017-10-25 ENCOUNTER — Encounter: Payer: Self-pay | Admitting: Internal Medicine

## 2017-10-25 ENCOUNTER — Ambulatory Visit (INDEPENDENT_AMBULATORY_CARE_PROVIDER_SITE_OTHER): Payer: Medicare Other | Admitting: Internal Medicine

## 2017-10-25 VITALS — BP 128/80 | HR 67 | Temp 97.7°F | Ht 64.0 in | Wt 187.0 lb

## 2017-10-25 DIAGNOSIS — J4521 Mild intermittent asthma with (acute) exacerbation: Secondary | ICD-10-CM

## 2017-10-25 MED ORDER — AZITHROMYCIN 250 MG PO TABS: ORAL_TABLET | ORAL | 0 refills | Status: AC

## 2017-10-25 NOTE — Progress Notes (Signed)
Date:  10/25/2017   Name:  Hayley Ewing   DOB:  Apr 16, 1946   MRN:  [id number]   Chief Complaint: Cough (Started Sunday. Has deep cough- yellow production. Chest congestion. Wheezing. No fever. )  Cough  This is a new problem. The current episode started in the past 7 days. The problem has been gradually worsening. The problem occurs every few minutes. The cough is productive of sputum. Associated symptoms include ear congestion, shortness of breath and wheezing. Pertinent negatives include no chest pain, chills, fever, headaches, postnasal drip, sore throat, sweats or weight loss. The symptoms are aggravated by exercise. She has tried nothing for the symptoms. There is no history of asthma, COPD or environmental allergies.     Review of Systems  Constitutional: Negative for chills, fever and weight loss.  HENT: Negative for congestion, postnasal drip and sore throat.   Respiratory: Positive for cough, chest tightness, shortness of breath and wheezing.   Cardiovascular: Negative for chest pain and palpitations.  Gastrointestinal: Negative for abdominal pain, nausea and vomiting.  Allergic/Immunologic: Negative for environmental allergies and food allergies.  Neurological: Negative for dizziness and headaches.    Patient Active Problem List   Diagnosis Date Noted  . Colitis 08/31/2017  . Essential hypertension 08/10/2017  . Systolic dysfunction 08/10/2017  . Asthma, persistent controlled 07/10/2017  . Left ventricular enlargement 06/22/2017  . Disorder of bursae of shoulder region 01/30/2017  . Displacement of lumbar intervertebral disc without myelopathy 01/30/2017  . Venous insufficiency of both lower extremities 06/14/2016  . Bladder cystocele 05/19/2015  . Degenerative arthritis of hip 05/19/2015    Prior to Admission medications   Medication Sig Start Date End Date Taking? Authorizing Provider  furosemide (LASIX) 20 MG tablet Take 1 tablet by mouth daily. 08/10/17 08/10/18  Yes [provider]  HYDROcodone-acetaminophen (NORCO) 10-325 MG tablet Take 1 tablet by mouth 2 (two) times daily.  10/27/15  Yes [provider]  lidocaine (LIDODERM) 5 % Place 1 patch onto the skin daily. Remove & Discard patch within 12 hours or as directed by MD   Yes [provider]  lisinopril (PRINIVIL,ZESTRIL) 5 MG tablet Take 1 tablet by mouth daily. 08/10/17 08/10/18 Yes [provider]  metoprolol succinate (TOPROL-XL) 25 MG 24 hr tablet Take 25 mg by mouth daily.   Yes [provider]  spironolactone (ALDACTONE) 25 MG tablet Take 0.5 tablets by mouth daily. 09/05/17 09/05/18 Yes [provider]  albuterol (PROVENTIL) (2.5 MG/3ML) 0.083% nebulizer solution Take 3 mLs (2.5 mg total) by nebulization every 6 (six) hours as needed for wheezing or shortness of breath. Patient not taking: Reported on 10/25/2017 06/28/17   Reubin Milan, MD  budesonide-formoterol [facility]) 80-4.5 MCG/ACT inhaler Inhale 2 puffs into the lungs 2 (two) times daily. Patient not taking: Reported on 10/25/2017 03/03/17   Reubin Milan, MD    No Known Allergies  Past Surgical History:  Procedure Laterality Date  . TOTAL ABDOMINAL HYSTERECTOMY  1998  . TOTAL HIP ARTHROPLASTY Right 2014    Social History   Tobacco Use  . Smoking status: Never Smoker  . Smokeless tobacco: Never Used  Substance Use Topics  . Alcohol use: Yes    Alcohol/week: 1.2 oz    Types: 2 Standard drinks or equivalent per week  . Drug use: No     Medication list has been reviewed and updated.  PHQ 2/9 Scores 11/01/2016 10/12/2016  PHQ - 2 Score 0 0    Physical Exam  Constitutional: She is oriented to person, place, and time. She appears well-developed. No distress.  HENT:  Head: Normocephalic and atraumatic.  Nose: Right sinus exhibits no maxillary sinus tenderness and no frontal sinus tenderness. Left sinus exhibits no maxillary sinus tenderness and no frontal sinus  tenderness.  Mouth/Throat: No posterior oropharyngeal edema or posterior oropharyngeal erythema.  Excessive cerumen bilaterally  Cardiovascular: Normal rate, regular rhythm and normal heart sounds.  Pulmonary/Chest: Effort normal. No respiratory distress. She has decreased breath sounds. She has no wheezes. She has no rhonchi.  Musculoskeletal: Normal range of motion.  Neurological: She is alert and oriented to person, place, and time.  Skin: Skin is warm and dry. No rash noted.  Psychiatric: She has a normal mood and affect. Her behavior is normal. Thought content normal.  Nursing note and vitals reviewed.   BP 128/80   Pulse 67   Temp 97.7 F (36.5 C) (Oral)   Ht 5\' 4"  (1.626 m)   Wt 187 lb (84.8 kg)   SpO2 98%   BMI 32.10 kg/m   Assessment and Plan: 1. Mild intermittent asthma with exacerbation Resume symbicort twice a day Zpak   No orders of the defined types were placed in this encounter.   Partially dictated using . Any errors are unintentional.  Animal nutritionist, MD [facility]  10/25/2017

## 2017-10-26 ENCOUNTER — Telehealth: Payer: Self-pay

## 2017-10-26 ENCOUNTER — Other Ambulatory Visit: Payer: Self-pay | Admitting: Internal Medicine

## 2017-10-26 DIAGNOSIS — R0609 Other forms of dyspnea: Secondary | ICD-10-CM | POA: Diagnosis not present

## 2017-10-26 DIAGNOSIS — I519 Heart disease, unspecified: Secondary | ICD-10-CM | POA: Diagnosis not present

## 2017-10-26 DIAGNOSIS — I10 Essential (primary) hypertension: Secondary | ICD-10-CM | POA: Diagnosis not present

## 2017-10-26 DIAGNOSIS — Z8679 Personal history of other diseases of the circulatory system: Secondary | ICD-10-CM | POA: Diagnosis not present

## 2017-10-26 MED ORDER — HYDROCOD POLST-CPM POLST ER 10-8 MG/5ML PO SUER: 5.0000 mL | Freq: Two times a day (BID) | ORAL | 0 refills | Status: DC

## 2017-10-26 NOTE — Telephone Encounter (Signed)
Tried to contact pt and inform X 2- her mailbox is full and cannot receive msgs at this time.

## 2017-10-26 NOTE — Telephone Encounter (Signed)
Patient called stating she was just seen yesterday for illness. Was not given her "normal prescription" of tussinex cough syrup to help with her cough. She said she has been coughing all night and all morning along with having SOB. Wants to know if we could call in cough medication.   Please Advise.

## 2017-10-26 NOTE — Telephone Encounter (Signed)
Sent Rx to [facility] 

## 2017-10-30 DIAGNOSIS — Z96641 Presence of right artificial hip joint: Secondary | ICD-10-CM | POA: Diagnosis not present

## 2017-10-30 DIAGNOSIS — R531 Weakness: Secondary | ICD-10-CM | POA: Diagnosis not present

## 2017-10-30 DIAGNOSIS — I428 Other cardiomyopathies: Secondary | ICD-10-CM | POA: Diagnosis not present

## 2017-10-30 DIAGNOSIS — I10 Essential (primary) hypertension: Secondary | ICD-10-CM | POA: Diagnosis not present

## 2017-10-30 DIAGNOSIS — M25561 Pain in right knee: Secondary | ICD-10-CM | POA: Diagnosis not present

## 2017-10-30 DIAGNOSIS — M545 Low back pain: Secondary | ICD-10-CM | POA: Diagnosis not present

## 2017-10-30 DIAGNOSIS — M1990 Unspecified osteoarthritis, unspecified site: Secondary | ICD-10-CM | POA: Diagnosis not present

## 2017-10-30 DIAGNOSIS — I5042 Chronic combined systolic (congestive) and diastolic (congestive) heart failure: Secondary | ICD-10-CM | POA: Diagnosis not present

## 2017-10-30 DIAGNOSIS — R700 Elevated erythrocyte sedimentation rate: Secondary | ICD-10-CM | POA: Diagnosis not present

## 2017-10-30 DIAGNOSIS — Z8679 Personal history of other diseases of the circulatory system: Secondary | ICD-10-CM | POA: Diagnosis not present

## 2017-10-30 DIAGNOSIS — I110 Hypertensive heart disease with heart failure: Secondary | ICD-10-CM | POA: Diagnosis not present

## 2017-10-30 DIAGNOSIS — M7989 Other specified soft tissue disorders: Secondary | ICD-10-CM | POA: Diagnosis not present

## 2017-10-30 DIAGNOSIS — R000 Tachycardia, unspecified: Secondary | ICD-10-CM | POA: Diagnosis not present

## 2017-10-30 DIAGNOSIS — M791 Myalgia, unspecified site: Secondary | ICD-10-CM | POA: Diagnosis not present

## 2017-10-30 DIAGNOSIS — I519 Heart disease, unspecified: Secondary | ICD-10-CM | POA: Diagnosis not present

## 2017-10-30 DIAGNOSIS — R0609 Other forms of dyspnea: Secondary | ICD-10-CM | POA: Diagnosis not present

## 2017-10-30 DIAGNOSIS — R52 Pain, unspecified: Secondary | ICD-10-CM | POA: Diagnosis not present

## 2017-10-30 DIAGNOSIS — M11232 Other chondrocalcinosis, left wrist: Secondary | ICD-10-CM | POA: Diagnosis not present

## 2017-11-01 ENCOUNTER — Ambulatory Visit: Payer: Medicare Other | Admitting: Internal Medicine

## 2017-11-01 DIAGNOSIS — G8929 Other chronic pain: Secondary | ICD-10-CM | POA: Diagnosis present

## 2017-11-01 DIAGNOSIS — R52 Pain, unspecified: Secondary | ICD-10-CM | POA: Diagnosis not present

## 2017-11-01 DIAGNOSIS — M25561 Pain in right knee: Secondary | ICD-10-CM | POA: Diagnosis not present

## 2017-11-01 DIAGNOSIS — Z96641 Presence of right artificial hip joint: Secondary | ICD-10-CM | POA: Diagnosis present

## 2017-11-01 DIAGNOSIS — J45909 Unspecified asthma, uncomplicated: Secondary | ICD-10-CM | POA: Diagnosis present

## 2017-11-01 DIAGNOSIS — M1990 Unspecified osteoarthritis, unspecified site: Secondary | ICD-10-CM | POA: Diagnosis present

## 2017-11-01 DIAGNOSIS — M19032 Primary osteoarthritis, left wrist: Secondary | ICD-10-CM | POA: Diagnosis present

## 2017-11-01 DIAGNOSIS — R000 Tachycardia, unspecified: Secondary | ICD-10-CM | POA: Diagnosis not present

## 2017-11-01 DIAGNOSIS — R531 Weakness: Secondary | ICD-10-CM | POA: Diagnosis not present

## 2017-11-01 DIAGNOSIS — M791 Myalgia, unspecified site: Secondary | ICD-10-CM | POA: Diagnosis not present

## 2017-11-01 DIAGNOSIS — M11232 Other chondrocalcinosis, left wrist: Secondary | ICD-10-CM | POA: Diagnosis present

## 2017-11-01 DIAGNOSIS — R700 Elevated erythrocyte sedimentation rate: Secondary | ICD-10-CM | POA: Diagnosis not present

## 2017-11-01 DIAGNOSIS — M25532 Pain in left wrist: Secondary | ICD-10-CM | POA: Diagnosis not present

## 2017-11-01 DIAGNOSIS — I110 Hypertensive heart disease with heart failure: Secondary | ICD-10-CM | POA: Diagnosis present

## 2017-11-01 DIAGNOSIS — I428 Other cardiomyopathies: Secondary | ICD-10-CM | POA: Diagnosis present

## 2017-11-01 DIAGNOSIS — M1812 Unilateral primary osteoarthritis of first carpometacarpal joint, left hand: Secondary | ICD-10-CM | POA: Diagnosis not present

## 2017-11-01 DIAGNOSIS — M545 Low back pain: Secondary | ICD-10-CM | POA: Diagnosis not present

## 2017-11-01 DIAGNOSIS — M7989 Other specified soft tissue disorders: Secondary | ICD-10-CM | POA: Diagnosis not present

## 2017-11-01 DIAGNOSIS — I872 Venous insufficiency (chronic) (peripheral): Secondary | ICD-10-CM | POA: Diagnosis present

## 2017-11-01 DIAGNOSIS — I5042 Chronic combined systolic (congestive) and diastolic (congestive) heart failure: Secondary | ICD-10-CM | POA: Diagnosis present

## 2017-11-03 ENCOUNTER — Ambulatory Visit: Payer: Medicare Other | Admitting: Internal Medicine

## 2017-11-03 DIAGNOSIS — R29898 Other symptoms and signs involving the musculoskeletal system: Secondary | ICD-10-CM | POA: Insufficient documentation

## 2017-11-03 MED ORDER — KETOROLAC TROMETHAMINE 15 MG/ML IJ SOLN
15.00 | INTRAMUSCULAR | Status: DC
Start: ? — End: 2017-11-03

## 2017-11-03 MED ORDER — HYDROCODONE-ACETAMINOPHEN 10-325 MG PO TABS
1.00 | ORAL_TABLET | ORAL | Status: DC
Start: 2017-11-04 — End: 2017-11-03

## 2017-11-03 MED ORDER — METOPROLOL SUCCINATE ER 25 MG PO TB24
25.00 | ORAL_TABLET | ORAL | Status: DC
Start: 2017-11-04 — End: 2017-11-03

## 2017-11-03 MED ORDER — MELATONIN 3 MG PO TABS
3.00 | ORAL_TABLET | ORAL | Status: DC
Start: 2017-11-03 — End: 2017-11-03

## 2017-11-03 MED ORDER — LIDOCAINE HCL 1 % IJ SOLN
0.50 | INTRAMUSCULAR | Status: DC
Start: ? — End: 2017-11-03

## 2017-11-03 MED ORDER — ENOXAPARIN SODIUM 40 MG/0.4ML ~~LOC~~ SOLN
40.00 | SUBCUTANEOUS | Status: DC
Start: 2017-11-03 — End: 2017-11-03

## 2017-11-06 ENCOUNTER — Telehealth: Payer: Self-pay

## 2017-11-06 NOTE — Telephone Encounter (Signed)
Erroneous encounter. Opened in error.

## 2017-11-06 NOTE — Telephone Encounter (Signed)
Transition Care Management Follow-Up Telephone Call   Date discharged and where: 11/03/17 from [facility]  How have you been since you were released from the hospital?  States her pain has not resolved since she was discharged from the hospital. Has been taking Ibuprofen and Norco since discharge but does not feel this is alleviating her pain. Pt states that since she was discharged, she has developed pain to her knees. States she called Dr. Jordan today for a refill of her Norco. While on the phone with pt, pt received returned call from Dr. Virgina Norfolk office. Dr. Jordan has agreed to refill Norco as prescribed.   Has also been applying lidocaine patches to help alleviate pain without much success. Advised she could also try alternating ice and heat for additional pain relief.  Pt also states she was also evaluated by PT on Saturday but was discharged because she is able to perform tasks independently. Further states she does not feel she would benefit from PT at this time.   Any patient concerns? Pt is wanting to be referred to Rheumatologist for further evaluation. Advised pt that I will make Dr. Judithann Graves aware of her request. However, if pain is not resolving, advised she return to ED for further evaluation. Pt declined stating she will discuss further with Dr. Judithann Graves tomorrow.  Items Reviewed: Verified = V   Meds: V  Allergies: V  Dietary Orders: Heart Healthy. Pt verbalized understanding about dietary limitations and restrictions   Functional Questionnaire:  Independent = I Dependent = D  ADLs: I  Dressing- I   Eating- I  Maintaining continence- I  Transferring- I  Transportation- I  Meal Prep- I  Managing Meds- I  Additional Items Reviewed:  Hospital f/u appt confirmed? Yes. Scheduled to see Dr. Judithann Graves on 11/07/17 @ 2:30pm. Denies need for transportation arrangements.  If their condition worsens, is the pt aware to call PCP or go to the Emergency Dept.? Yes. Verbalized  acceptance and understanding  Was the patient provided with contact information for the PCP's office or ED? Yes  Was to pt encouraged to call back with questions or concerns? Yes

## 2017-11-07 ENCOUNTER — Ambulatory Visit (INDEPENDENT_AMBULATORY_CARE_PROVIDER_SITE_OTHER): Payer: Medicare Other | Admitting: Internal Medicine

## 2017-11-07 ENCOUNTER — Encounter: Payer: Self-pay | Admitting: Internal Medicine

## 2017-11-07 VITALS — BP 130/82 | HR 73 | Ht 64.0 in | Wt 189.0 lb

## 2017-11-07 DIAGNOSIS — I517 Cardiomegaly: Secondary | ICD-10-CM

## 2017-11-07 DIAGNOSIS — M1990 Unspecified osteoarthritis, unspecified site: Secondary | ICD-10-CM | POA: Diagnosis not present

## 2017-11-07 DIAGNOSIS — M19032 Primary osteoarthritis, left wrist: Secondary | ICD-10-CM

## 2017-11-07 DIAGNOSIS — R7989 Other specified abnormal findings of blood chemistry: Secondary | ICD-10-CM | POA: Diagnosis not present

## 2017-11-07 DIAGNOSIS — I10 Essential (primary) hypertension: Secondary | ICD-10-CM | POA: Diagnosis not present

## 2017-11-07 NOTE — Progress Notes (Signed)
Date:  11/07/2017   Name:  Hayley Ewing   DOB:  1945-09-19   MRN:  [id number]   Chief Complaint: Hospitalization Follow-up (Stayed in hospital for 1 week. Tried to get appt with a Rhuematologist but they were so booked up nobody could see her at the time. ) Hospital follow up from [facility].  Admitted from 11/01/17 to 11/03/17 for diffuse pain.  She received a TOC call on 11/06/17, within 2 working days of discharge. Discharge Diagnoses:  Principal Problem: Left wrist pain - possibly pseudogout but not crystal proven as there were none in the aspirate Active Problems: Weakness of both lower extremities - felt to be more due to pain than structural or neurological.  PT evaluation was done.  She was not felt to be a candidate for home therapy. Labs:  Uric acid 7.7 CRP 25, Sed Rate 32 Hgb 9.1 Iron from 08/2017 low at 11 with Ferritin 40 (up from 10)  She had done better since being home.  She has vicodin from her pain MD and is feeling normal. 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 Her wrist is much better - still a bit stiff.  She is able to walk as usual with a cane.   Review of Systems  Constitutional: Negative for chills, fatigue and fever.  Respiratory: Negative for chest tightness, shortness of breath and wheezing.   Cardiovascular: Positive for leg swelling. Negative for chest pain and palpitations.  Gastrointestinal: Negative for abdominal pain.  Musculoskeletal: Positive for arthralgias, gait problem and joint swelling. Negative for myalgias and neck stiffness.  Neurological: Negative for dizziness and headaches.    Patient Active Problem List   Diagnosis Date Noted  . Colitis 08/31/2017  . Essential hypertension 08/10/2017  . Systolic dysfunction 08/10/2017  . Asthma, persistent controlled 07/10/2017  . Left ventricular enlargement 06/22/2017  . Disorder of bursae of shoulder region 01/30/2017  . Displacement of lumbar  intervertebral disc without myelopathy 01/30/2017  . Venous insufficiency of both lower extremities 06/14/2016  . Bladder cystocele 05/19/2015  . Degenerative arthritis of hip 05/19/2015    Prior to Admission medications   Medication Sig Start Date End Date Taking? Authorizing Provider  albuterol (PROVENTIL) (2.5 MG/3ML) 0.083% nebulizer solution Take 3 mLs (2.5 mg total) by nebulization every 6 (six) hours as needed for wheezing or shortness of breath. Patient not taking: Reported on 10/25/2017 06/28/17   Reubin Milan, MD  budesonide-formoterol [facility]) 80-4.5 MCG/ACT inhaler Inhale 2 puffs into the lungs 2 (two) times daily. 03/03/17   Reubin Milan, MD  chlorpheniramine-HYDROcodone [facility]ENNK[facility]) 10-8 MG/5ML SUER Take 5 mLs by mouth 2 (two) times daily. Patient not taking: Reported on 11/06/2017 10/26/17   Reubin Milan, MD  furosemide (LASIX) 20 MG tablet Take 1 tablet by mouth daily. 08/10/17 08/10/18  [provider]  HYDROcodone-acetaminophen (NORCO) 10-325 MG tablet Take 1 tablet by mouth 2 (two) times daily.  10/27/15   [provider]  ibuprofen (ADVIL,MOTRIN) 600 MG tablet Take 600 mg by mouth 2 (two) times daily.    [provider]  lidocaine (LIDODERM) 5 % Place 1 patch onto the skin daily. Remove & Discard patch within 12 hours or as directed by MD    [provider]  lisinopril (PRINIVIL,ZESTRIL) 5 MG tablet Take 1 tablet by mouth daily. 08/10/17 08/10/18  [provider]  metoprolol succinate (TOPROL-XL) 25 MG 24 hr tablet Take 25  mg by mouth daily.    [provider]  spironolactone (ALDACTONE) 25 MG tablet Take 0.5 tablets by mouth daily. 09/05/17 09/05/18  [provider]    No Known Allergies  Past Surgical History:  Procedure Laterality Date  . TOTAL ABDOMINAL HYSTERECTOMY  1998  . TOTAL HIP ARTHROPLASTY Right 2014    Social History   Tobacco Use  . Smoking status: Never Smoker  .  Smokeless tobacco: Never Used  Substance Use Topics  . Alcohol use: Yes    Alcohol/week: 1.2 oz    Types: 2 Standard drinks or equivalent per week  . Drug use: No     Medication list has been reviewed and updated.  PHQ 2/9 Scores 11/07/2017 11/01/2016 10/12/2016  PHQ - 2 Score 0 0 0  PHQ- 9 Score 0 - -    Physical Exam  Constitutional: She is oriented to person, place, and time. She appears well-developed. No distress.  HENT:  Head: Normocephalic and atraumatic.  Eyes: Pupils are equal, round, and reactive to light.  Cardiovascular: Normal rate, regular rhythm and normal heart sounds.  Pulmonary/Chest: Effort normal. No respiratory distress. She has no wheezes. She has no rales.  Abdominal: Soft.  Musculoskeletal: Normal range of motion. She exhibits edema (trace ankle edema).       Left wrist: She exhibits tenderness and swelling. She exhibits no effusion, no crepitus and no deformity.  Neurological: She is alert and oriented to person, place, and time.  Skin: Skin is warm and dry. No rash noted.  Psychiatric: She has a normal mood and affect. Her behavior is normal. Thought content normal.    BP 130/82   Pulse 73   Ht 5\' 4"  (1.626 m)   Wt 189 lb (85.7 kg)   SpO2 99%   BMI 32.44 kg/m   Assessment and Plan: 1. Joint inflammation of left hand and wrist Will check RF and ANA - if negative will continue with watchful waiting If wrist swelling recurs, will refer. - Rheumatoid factor - ANA w/Reflex if Positive  2. Essential hypertension controlled  3. Left ventricular enlargement To follow up with Cardiology in the near future   No orders of the defined types were placed in this encounter.   Partially dictated using . Any errors are unintentional.  Animal nutritionist, MD [facility]  11/07/2017

## 2017-11-08 LAB — RHEUMATOID FACTOR: Rhuematoid fact SerPl-aCnc: 17.4 IU/mL — ABNORMAL HIGH (ref 0.0–13.9)

## 2017-11-08 LAB — ANA W/REFLEX IF POSITIVE: ANA: NEGATIVE

## 2017-11-09 ENCOUNTER — Ambulatory Visit (INDEPENDENT_AMBULATORY_CARE_PROVIDER_SITE_OTHER): Payer: Medicare Other

## 2017-11-09 VITALS — BP 114/60 | HR 66 | Temp 98.4°F | Resp 16 | Ht 64.0 in | Wt 189.6 lb

## 2017-11-09 DIAGNOSIS — Z0000 Encounter for general adult medical examination without abnormal findings: Secondary | ICD-10-CM | POA: Diagnosis not present

## 2017-11-09 DIAGNOSIS — Z23 Encounter for immunization: Secondary | ICD-10-CM

## 2017-11-09 DIAGNOSIS — S8391XA Sprain of unspecified site of right knee, initial encounter: Secondary | ICD-10-CM | POA: Diagnosis not present

## 2017-11-09 DIAGNOSIS — S8991XA Unspecified injury of right lower leg, initial encounter: Secondary | ICD-10-CM | POA: Diagnosis not present

## 2017-11-09 DIAGNOSIS — Z9181 History of falling: Secondary | ICD-10-CM

## 2017-11-09 DIAGNOSIS — Z1231 Encounter for screening mammogram for malignant neoplasm of breast: Secondary | ICD-10-CM | POA: Diagnosis not present

## 2017-11-09 DIAGNOSIS — Z1239 Encounter for other screening for malignant neoplasm of breast: Secondary | ICD-10-CM

## 2017-11-09 DIAGNOSIS — H539 Unspecified visual disturbance: Secondary | ICD-10-CM

## 2017-11-09 DIAGNOSIS — W19XXXA Unspecified fall, initial encounter: Secondary | ICD-10-CM | POA: Diagnosis not present

## 2017-11-09 NOTE — Progress Notes (Signed)
Subjective:   Hayley Ewing is a 72 y.o. female who presents for Medicare Annual (Subsequent) preventive examination.  Review of Systems:  N/A Cardiac Risk Factors include: advanced age (>54men, >3 women);hypertension;obesity (BMI >30kg/m2);sedentary lifestyle     Objective:     Vitals: BP 114/60 (BP Location: Right Arm, Patient Position: Sitting, Cuff Size: Normal)   Pulse 66   Temp 98.4 F (36.9 C) (Oral)   Resp 16   Ht 5\' 4"  (1.626 m)   Wt 189 lb 9.6 oz (86 kg)   SpO2 93%   BMI 32.54 kg/m   Body mass index is 32.54 kg/m.  Advanced Directives 11/09/2017 11/01/2016 11/16/2015  Does Patient Have a Medical Advance Directive? No No No  Would patient like information on creating a medical advance directive? - - No - patient declined information    Tobacco Social History   Tobacco Use  Smoking Status Never Smoker  Smokeless Tobacco Never Used  Tobacco Comment   smoking cessation materials not required     Counseling given: No Comment: smoking cessation materials not required   Clinical Intake:  Pre-visit preparation completed: Yes  Pain : 0-10 Pain Score: 9  Pain Type: Chronic pain   BMI - recorded: 32.54 Nutritional Status: BMI > 30  Obese Nutritional Risks: None Diabetes: No  How often do you need to have someone help you when you read instructions, pamphlets, or other written materials from your doctor or pharmacy?: 1 - Never  Interpreter Needed?: No  Information entered by :: AEversole, LPN  Past Medical History:  Diagnosis Date  . Asthma   . Bursitis   . Cardiomegaly   . Female cystocele   . Hypertension   . Osteoarthritis   . Sinusitis   . Venous insufficiency    Past Surgical History:  Procedure Laterality Date  . TOTAL ABDOMINAL HYSTERECTOMY  1998  . TOTAL HIP ARTHROPLASTY Right 2014   Family History  Problem Relation Age of Onset  . Brain cancer Mother   . Brain cancer Father    Social History   Socioeconomic History  .  Marital status: Married    Spouse name: Brett Canales  . Number of children: 3  . Years of education: some college  . Highest education level: 12th grade  Occupational History  . Occupation: Retired  Engineer, production  . Financial resource strain: Not hard at all  . Food insecurity:    Worry: Never true    Inability: Never true  . Transportation needs:    Medical: No    Non-medical: No  Tobacco Use  . Smoking status: Never Smoker  . Smokeless tobacco: Never Used  . Tobacco comment: smoking cessation materials not required  Substance and Sexual Activity  . Alcohol use: Not Currently  . Drug use: No  . Sexual activity: Not Currently  Lifestyle  . Physical activity:    Days per week: 0 days    Minutes per session: 0 min  . Stress: Very much  Relationships  . Social connections:    Talks on phone: Patient refused    Gets together: Patient refused    Attends religious service: Patient refused    Active member of club or organization: Patient refused    Attends meetings of clubs or organizations: Patient refused    Relationship status: Married  Other Topics Concern  . Not on file  Social History Narrative  . Not on file    Outpatient Encounter Medications as of 11/09/2017  Medication Sig  .  albuterol (PROVENTIL) (2.5 MG/3ML) 0.083% nebulizer solution Take 3 mLs (2.5 mg total) by nebulization every 6 (six) hours as needed for wheezing or shortness of breath.  . budesonide-formoterol (SYMBICORT) 80-4.5 MCG/ACT inhaler Inhale 2 puffs into the lungs 2 (two) times daily.  . furosemide (LASIX) 20 MG tablet Take 1 tablet by mouth daily.  Marland Kitchen HYDROcodone-acetaminophen (NORCO) 10-325 MG tablet Take 1 tablet by mouth 2 (two) times daily.   Marland Kitchen ibuprofen (ADVIL,MOTRIN) 600 MG tablet Take 600 mg by mouth 2 (two) times daily.  Marland Kitchen lidocaine (LIDODERM) 5 % Place 1 patch onto the skin daily. Remove & Discard patch within 12 hours or as directed by MD  . lisinopril (PRINIVIL,ZESTRIL) 5 MG tablet Take 1  tablet by mouth daily.  . metoprolol succinate (TOPROL-XL) 25 MG 24 hr tablet Take 25 mg by mouth daily.  Marland Kitchen spironolactone (ALDACTONE) 25 MG tablet Take 0.5 tablets by mouth daily.   Facility-Administered Encounter Medications as of 11/09/2017  Medication  . albuterol (PROVENTIL) (2.5 MG/3ML) 0.083% nebulizer solution 2.5 mg    Activities of Daily Living In your present state of health, do you have any difficulty performing the following activities: 11/09/2017 11/07/2017  Hearing? N N  Comment denies hearing aids -  Vision? N N  Comment wears eyeglasses; cataracts - Referred to [facility] for an updated eye exam -  Difficulty concentrating or making decisions? Y N  Comment short term memory loss -  Walking or climbing stairs? Y N  Comment weakness, fatigue and chronic pain -  Dressing or bathing? N N  Doing errands, shopping? N N  Preparing Food and eating ? N -  Comment denies dentures -  Using the Toilet? N -  In the past six months, have you accidently leaked urine? N -  Do you have problems with loss of bowel control? N -  Managing your Medications? N -  Managing your Finances? N -  Housekeeping or managing your Housekeeping? N -  Some recent data might be hidden    Patient Care Team: Reubin Milan, MD as PCP - General (Internal Medicine) Jordan, Jimmy J, MD as Referring Physician (Physical Medicine and Rehabilitation) Blazing, Tyrone Apple, MD as Referring Physician (Internal Medicine)    Assessment:   This is a routine wellness examination for Hayley Ewing.  Exercise Activities and Dietary recommendations Current Exercise Habits: The patient does not participate in regular exercise at present, Exercise limited by: Other - see comments(weakness, fatigue and chronic pain)  Goals    . DIET - INCREASE WATER INTAKE     Recommend to drink at least 6-8 8oz glasses of water per day.    . Prevent falls     Recommend to remove any items from the home that may cause slips or  trips.        Fall Risk Fall Risk  11/09/2017 11/07/2017 11/01/2016 10/12/2016 03/21/2016  Falls in the past year? Yes No Yes Yes Yes  Comment tripped over stool - - - Emmi Telephone Survey: data to providers prior to load  Number falls in past yr: 1 - 1 2 or more 1  Comment - - - - Emmi Telephone Survey Actual Response = 1  Injury with Fall? No - No Yes Yes  Comment - - knocked breath out of pt. bruised hip/ had xrays. -  Risk Factor Category  - - - High Fall Risk -  Risk for fall due to : Impaired balance/gait;Impaired vision;Medication side effect;History of fall(s) - - - -  Risk for fall due to: Comment chronic pain, cataracts, wears eyeglasses, narcotic use; ambulates with a broken wooden cane - - - -  Follow up Falls evaluation completed;Education provided;Falls prevention discussed - - - -   Is the home free of loose throw rugs in walkways, pet beds, electrical cords, etc? Yes Adequate lighting to reduce risk of falls?  Yes In addition, does the patient have any of the following: Stairs in or around the home WITH handrails? Yes Grab bars in the bathroom? Yes  Shower chair or a place to sit while bathing? Yes Use of an elevated toilet seat or a handicapped toilet? Yes Use of a cane, walker or w/c? Yes, cane  Timed Get Up and Go Performed: Yes. Pt ambulated 10 feet within 32 sec. Gait  Highly unsteady, slow and with the use of an assistive device. This assistive device is a wooden can that has broken and been repaired with tape. Pt would benefit from an alternative assistive device for ambulation. D/t pt chronic pain and weakness of her extremities, pt is a high risk for fall and would also benefit from PT for strengthening exercises and retraining for ambulation. Fall risk prevention has been discussed.   Community Resource Referral not required for installation of grab bars in the shower, shower chair or an elevated toilet seat. However, [employer] Referral has been sent to  Care Guide for PT.  Depression Screen PHQ 2/9 Scores 11/09/2017 11/07/2017 11/01/2016 10/12/2016  PHQ - 2 Score 6 0 0 0  PHQ- 9 Score 8 0 - -     Cognitive Function     6CIT Screen 11/09/2017 11/01/2016  What Year? 0 points 0 points  What month? 3 points 0 points  What time? 0 points 0 points  Count back from 20 0 points 0 points  Months in reverse 4 points 4 points  Repeat phrase 0 points 0 points  Total Score 7 4    Immunization History  Administered Date(s) Administered  . Pneumococcal Conjugate-13 11/01/2016  . Pneumococcal Polysaccharide-23 11/09/2017  . Tdap 04/30/2008    Qualifies for Shingles Vaccine? Yes. Due for Zostavax or Shingrix vaccine. Education has been provided regarding the importance of this vaccine. Pt has been advised to call her insurance company to determine her out of pocket expense. Advised she may also receive this vaccine at her local pharmacy or Health Dept. Verbalized acceptance and understanding.  Overdue for Flu vaccine. Education has been provided regarding the importance of this vaccine. Pt has been advised to receive this vaccine when appropriate. Verbalized acceptance and understanding.  Due for Tdap vaccine on or after 04/30/18. Education has been provided regarding the importance of this vaccine. Pt has been advised to receive this vaccine when due. Verbalized acceptance and understanding.  Screening Tests Health Maintenance  Topic Date Due  . MAMMOGRAM  05/27/2015  . INFLUENZA VACCINE  05/25/2018 (Originally 02/22/2018)  . TETANUS/TDAP  04/30/2018  . COLONOSCOPY  08/27/2018  . Hepatitis C Screening  Completed  . PNA vac Low Risk Adult  Completed  . DEXA SCAN  Discontinued    Cancer Screenings: Lung: Low Dose CT Chest recommended if Age 72-80 years, 30 pack-year currently smoking OR have quit w/in 15years. Patient does not qualify. Breast:  Up to date on Mammogram? No. Ordered today. Message sent to referral coordinator for scheduling  purposes. Pt is aware that she will receive a call from our office re: her appt. Up to date of Bone Density/Dexa? No. Pt  declined my offer to order this screening. Education has been provided regarding the importance of this screening but still declined. Colorectal: Completed colonoscopy 08/27/08. Repeat every 10 years  Additional Screenings: Hepatitis C Screening: Completed 11/01/16    Plan:  I have personally reviewed and addressed the Medicare Annual Wellness questionnaire and have noted the following in the patient's chart:  A. Medical and social history B. Use of alcohol, tobacco or illicit drugs  C. Current medications and supplements D. Functional ability and status E.  Nutritional status F.  Physical activity G. Advance directives H. List of other physicians I.  Hospitalizations, surgeries, and ER visits in previous 12 months J.  Vitals K. Screenings such as hearing and vision if needed, cognitive and depression L. Referrals and appointments  In addition, I have reviewed and discussed with patient certain preventive protocols, quality metrics, and best practice recommendations. A written personalized care plan for preventive services as well as general preventive health recommendations were provided to patient.  Signed,  Deon Pilling, LPN Nurse Health Advisor  MD Recommendations: Due for Zostavax or Shingrix vaccine. Education has been provided regarding the importance of this vaccine. Pt has been advised to call her insurance company to determine her out of pocket expense. Advised she may also receive this vaccine at her local pharmacy or Health Dept. Verbalized acceptance and understanding.  Overdue for Flu vaccine. Education has been provided regarding the importance of this vaccine. Pt has been advised to receive this vaccine when appropriate. Verbalized acceptance and understanding.  Due for Tdap vaccine on or after 04/30/18. Education has been provided regarding the  importance of this vaccine. Pt has been advised to receive this vaccine when due. Verbalized acceptance and understanding.  Breast screening. Unable to locate any reports. Ordered today. Message sent to referral coordinator for scheduling purposes. Pt is aware that she will receive a call from our office re: her appt.  Pt ambulated 10 feet within 32 sec. Gait  Highly unsteady, slow and with the use of an assistive device. This assistive device is a wooden can that has broken and been repaired with tape. Pt would benefit from an alternative assistive device for ambulation. D/t pt chronic pain and weakness of her extremities, pt is a high risk for fall and would also benefit from PT for strengthening exercises and retraining for ambulation. Fall risk prevention has been discussed. Community Resource Referral not required for installation of grab bars in the shower, shower chair or an elevated toilet seat. However, [employer] Referral has been sent to Care Guide for PT.

## 2017-11-09 NOTE — Patient Instructions (Signed)
Ms. Hayley Ewing , Thank you for taking time to come for your Medicare Wellness Visit. I appreciate your ongoing commitment to your health goals. Please review the following plan we discussed and let me know if I can assist you in the future.   Screening recommendations/referrals: Colorectal Screening: Completed colonoscopy 08/27/08. Repeat every 10 years Mammogram: Ordered today. You will receive a call from our office regarding your appointment. Bone Density: Decliuned  Vision and Dental Exams: Recommended annual ophthalmology exams for early detection of glaucoma and other disorders of the eye Recommended annual dental exams for proper oral hygiene  Vaccinations: Influenza vaccine: Overdue Pneumococcal vaccine: Completed series today Tdap vaccine: Up to date Shingles vaccine: Please call your insurance company to determine your out of pocket expense for the Shingrix vaccine. You may also receive this vaccine at your local pharmacy or Health Dept.  Advanced directives: Advance directive discussed with you today. I have provided a copy for you to complete at home and have notarized. Once this is complete please bring a copy in to our office so we can scan it into your chart.  Conditions/risks identified: Recommend to drink at least 6-8 8oz glasses of water per day.  Next appointment: Please schedule your Annual Wellness Visit with your Nurse Health Advisor in one year.  Preventive Care 58 Years and Older, Female Preventive care refers to lifestyle choices and visits with your health care provider that can promote health and wellness. What does preventive care include?  A yearly physical exam. This is also called an annual well check.  Dental exams once or twice a year.  Routine eye exams. Ask your health care provider how often you should have your eyes checked.  Personal lifestyle choices, including:  Daily care of your teeth and gums.  Regular physical activity.  Eating a healthy  diet.  Avoiding tobacco and drug use.  Limiting alcohol use.  Practicing safe sex.  Taking low-dose aspirin every day.  Taking vitamin and mineral supplements as recommended by your health care provider. What happens during an annual well check? The services and screenings done by your health care provider during your annual well check will depend on your age, overall health, lifestyle risk factors, and family history of disease. Counseling  Your health care provider may ask you questions about your:  Alcohol use.  Tobacco use.  Drug use.  Emotional well-being.  Home and relationship well-being.  Sexual activity.  Eating habits.  History of falls.  Memory and ability to understand (cognition).  Work and work Astronomer.  Reproductive health. Screening  You may have the following tests or measurements:  Height, weight, and BMI.  Blood pressure.  Lipid and cholesterol levels. These may be checked every 5 years, or more frequently if you are over 60 years old.  Skin check.  Lung cancer screening. You may have this screening every year starting at age 89 if you have a 30-pack-year history of smoking and currently smoke or have quit within the past 15 years.  Fecal occult blood test (FOBT) of the stool. You may have this test every year starting at age 49.  Flexible sigmoidoscopy or colonoscopy. You may have a sigmoidoscopy every 5 years or a colonoscopy every 10 years starting at age 15.  Hepatitis C blood test.  Hepatitis B blood test.  Sexually transmitted disease (STD) testing.  Diabetes screening. This is done by checking your blood sugar (glucose) after you have not eaten for a while (fasting). You may have this  done every 1-3 years.  Bone density scan. This is done to screen for osteoporosis. You may have this done starting at age 79.  Mammogram. This may be done every 1-2 years. Talk to your health care provider about how often you should have  regular mammograms. Talk with your health care provider about your test results, treatment options, and if necessary, the need for more tests. Vaccines  Your health care provider may recommend certain vaccines, such as:  Influenza vaccine. This is recommended every year.  Tetanus, diphtheria, and acellular pertussis (Tdap, Td) vaccine. You may need a Td booster every 10 years.  Zoster vaccine. You may need this after age 4.  Pneumococcal 13-valent conjugate (PCV13) vaccine. One dose is recommended after age 62.  Pneumococcal polysaccharide (PPSV23) vaccine. One dose is recommended after age 41. Talk to your health care provider about which screenings and vaccines you need and how often you need them. This information is not intended to replace advice given to you by your health care provider. Make sure you discuss any questions you have with your health care provider. Document Released: 08/07/2015 Document Revised: 03/30/2016 Document Reviewed: 05/12/2015 Elsevier Interactive Patient Education  2017 [place] Prevention in the Home Falls can cause injuries. They can happen to people of all ages. There are many things you can do to make your home safe and to help prevent falls. What can I do on the outside of my home?  Regularly fix the edges of walkways and driveways and fix any cracks.  Remove anything that might make you trip as you walk through a door, such as a raised step or threshold.  Trim any bushes or trees on the path to your home.  Use bright outdoor lighting.  Clear any walking paths of anything that might make someone trip, such as rocks or tools.  Regularly check to see if handrails are loose or broken. Make sure that both sides of any steps have handrails.  Any raised decks and porches should have guardrails on the edges.  Have any leaves, snow, or ice cleared regularly.  Use sand or salt on walking paths during winter.  Clean up any spills in your  garage right away. This includes oil or grease spills. What can I do in the bathroom?  Use night lights.  Install grab bars by the toilet and in the tub and shower. Do not use towel bars as grab bars.  Use non-skid mats or decals in the tub or shower.  If you need to sit down in the shower, use a plastic, non-slip stool.  Keep the floor dry. Clean up any water that spills on the floor as soon as it happens.  Remove soap buildup in the tub or shower regularly.  Attach bath mats securely with double-sided non-slip rug tape.  Do not have throw rugs and other things on the floor that can make you trip. What can I do in the bedroom?  Use night lights.  Make sure that you have a light by your bed that is easy to reach.  Do not use any sheets or blankets that are too big for your bed. They should not hang down onto the floor.  Have a firm chair that has side arms. You can use this for support while you get dressed.  Do not have throw rugs and other things on the floor that can make you trip. What can I do in the kitchen?  Clean up any spills  right away.  Avoid walking on wet floors.  Keep items that you use a lot in easy-to-reach places.  If you need to reach something above you, use a strong step stool that has a grab bar.  Keep electrical cords out of the way.  Do not use floor polish or wax that makes floors slippery. If you must use wax, use non-skid floor wax.  Do not have throw rugs and other things on the floor that can make you trip. What can I do with my stairs?  Do not leave any items on the stairs.  Make sure that there are handrails on both sides of the stairs and use them. Fix handrails that are broken or loose. Make sure that handrails are as long as the stairways.  Check any carpeting to make sure that it is firmly attached to the stairs. Fix any carpet that is loose or worn.  Avoid having throw rugs at the top or bottom of the stairs. If you do have throw  rugs, attach them to the floor with carpet tape.  Make sure that you have a light switch at the top of the stairs and the bottom of the stairs. If you do not have them, ask someone to add them for you. What else can I do to help prevent falls?  Wear shoes that:  Do not have high heels.  Have rubber bottoms.  Are comfortable and fit you well.  Are closed at the toe. Do not wear sandals.  If you use a stepladder:  Make sure that it is fully opened. Do not climb a closed stepladder.  Make sure that both sides of the stepladder are locked into place.  Ask someone to hold it for you, if possible.  Clearly mark and make sure that you can see:  Any grab bars or handrails.  First and last steps.  Where the edge of each step is.  Use tools that help you move around (mobility aids) if they are needed. These include:  Canes.  Walkers.  Scooters.  Crutches.  Turn on the lights when you go into a dark area. Replace any light bulbs as soon as they burn out.  Set up your furniture so you have a clear path. Avoid moving your furniture around.  If any of your floors are uneven, fix them.  If there are any pets around you, be aware of where they are.  Review your medicines with your doctor. Some medicines can make you feel dizzy. This can increase your chance of falling. Ask your doctor what other things that you can do to help prevent falls. This information is not intended to replace advice given to you by your health care provider. Make sure you discuss any questions you have with your health care provider. Document Released: 05/07/2009 Document Revised: 12/17/2015 Document Reviewed: 08/15/2014 Elsevier Interactive Patient Education  2017 [employer].

## 2017-11-11 LAB — CYCLIC CITRUL PEPTIDE ANTIBODY, IGG/IGA: CYCLIC CITRULLIN PEPTIDE AB: 6 U (ref 0–19)

## 2017-11-11 LAB — SPECIMEN STATUS REPORT

## 2017-11-14 DIAGNOSIS — M25572 Pain in left ankle and joints of left foot: Secondary | ICD-10-CM | POA: Diagnosis not present

## 2017-11-14 DIAGNOSIS — I10 Essential (primary) hypertension: Secondary | ICD-10-CM | POA: Diagnosis not present

## 2017-11-14 DIAGNOSIS — M1120 Other chondrocalcinosis, unspecified site: Secondary | ICD-10-CM | POA: Diagnosis not present

## 2017-11-14 DIAGNOSIS — I5022 Chronic systolic (congestive) heart failure: Secondary | ICD-10-CM | POA: Diagnosis not present

## 2017-11-16 ENCOUNTER — Ambulatory Visit (INDEPENDENT_AMBULATORY_CARE_PROVIDER_SITE_OTHER): Payer: Medicare Other | Admitting: Internal Medicine

## 2017-11-16 ENCOUNTER — Encounter: Payer: Self-pay | Admitting: Internal Medicine

## 2017-11-16 VITALS — BP 132/78 | HR 71 | Ht 64.0 in | Wt 189.0 lb

## 2017-11-16 DIAGNOSIS — M1189 Other specified crystal arthropathies, multiple sites: Secondary | ICD-10-CM | POA: Diagnosis not present

## 2017-11-16 MED ORDER — COLCHICINE 0.6 MG PO TABS: 0.6000 mg | ORAL_TABLET | Freq: Two times a day (BID) | ORAL | 0 refills | Status: DC | PRN

## 2017-11-16 NOTE — Progress Notes (Signed)
Date:  11/16/2017   Name:  Hayley Ewing   DOB:  27-Nov-1945   MRN:  [id number]   Chief Complaint: Gout (Seen [facility] cardiologist for left ankle/foot. Was given colchicine for gout in ankle. Taking medication but was told to see PCP to see if needing antibiotic. States its a lot better than was.) Pt was seen in Cardiology clinic several days ago.  She complained of new left ankle pain which was suspicious for gout.  She was prescribed colchicine and told to follow up here.  She was recently in the hospital with joint pain, primarily in the wrist.  The joint was tapped but the fluid did not reveal any crystals or infection.  Xrays mentioned chondrocalcinosis and the possibility of pseudogout was discussed.  Her joint pain improved without specific treatment other than vicodin given for chronic back pain. Uric acid at [facility] was 7.7, CRP elevated.  Recent RF, CCP and ANA were normal. She is on day 3 of colchicine and is at least 50% improved.  No bowel issues noted.  Review of Systems  Constitutional: Negative for chills, fatigue and fever.  Respiratory: Negative for chest tightness, shortness of breath and wheezing.   Cardiovascular: Positive for leg swelling. Negative for chest pain and palpitations.  Gastrointestinal: Negative for abdominal pain.  Musculoskeletal: Positive for arthralgias, gait problem, joint swelling and neck stiffness. Negative for myalgias.  Neurological: Negative for dizziness and headaches.    Patient Active Problem List   Diagnosis Date Noted  . Joint inflammation of left hand and wrist 11/07/2017  . Weakness of both lower extremities 11/03/2017  . Colitis 08/31/2017  . Essential hypertension 08/10/2017  . Systolic dysfunction 08/10/2017  . Asthma, persistent controlled 07/10/2017  . Left ventricular enlargement 06/22/2017  . Disorder of bursae of shoulder region 01/30/2017  . Displacement of lumbar intervertebral disc without myelopathy 01/30/2017  . Venous  insufficiency of both lower extremities 06/14/2016  . Bladder cystocele 05/19/2015  . Degenerative arthritis of hip 05/19/2015    Prior to Admission medications   Medication Sig Start Date End Date Taking? Authorizing Provider  albuterol (PROVENTIL) (2.5 MG/3ML) 0.083% nebulizer solution Take 3 mLs (2.5 mg total) by nebulization every 6 (six) hours as needed for wheezing or shortness of breath. 06/28/17   Reubin Milan, MD  budesonide-formoterol [facility]) 80-4.5 MCG/ACT inhaler Inhale 2 puffs into the lungs 2 (two) times daily. 03/03/17   Reubin Milan, MD  furosemide (LASIX) 20 MG tablet Take 1 tablet by mouth daily. 08/10/17 08/10/18  [provider]  HYDROcodone-acetaminophen (NORCO) 10-325 MG tablet Take 1 tablet by mouth 2 (two) times daily.  10/27/15   [provider]  ibuprofen (ADVIL,MOTRIN) 600 MG tablet Take 600 mg by mouth 2 (two) times daily.    [provider]  lidocaine (LIDODERM) 5 % Place 1 patch onto the skin daily. Remove & Discard patch within 12 hours or as directed by MD    [provider]  lisinopril (PRINIVIL,ZESTRIL) 5 MG tablet Take 1 tablet by mouth daily. 08/10/17 08/10/18  [provider]  metoprolol succinate (TOPROL-XL) 25 MG 24 hr tablet Take 25 mg by mouth daily.    [provider]  spironolactone (ALDACTONE) 25 MG tablet Take 0.5 tablets by mouth daily. 09/05/17 09/05/18  [provider]    No Known Allergies  Past Surgical History:  Procedure Laterality Date  . TOTAL ABDOMINAL HYSTERECTOMY  1998  . TOTAL HIP ARTHROPLASTY Right 2014    Social History  Tobacco Use  . Smoking status: Never Smoker  . Smokeless tobacco: Never Used  . Tobacco comment: smoking cessation materials not required  Substance Use Topics  . Alcohol use: Not Currently  . Drug use: No     Medication list has been reviewed and updated.  PHQ 2/9 Scores 11/09/2017 11/07/2017 11/01/2016 10/12/2016  PHQ - 2 Score 6 0 0 0   PHQ- 9 Score 8 0 - -    Physical Exam  Constitutional: She is oriented to person, place, and time. She appears well-developed. No distress.  HENT:  Head: Normocephalic and atraumatic.  Pulmonary/Chest: Effort normal. No respiratory distress.  Musculoskeletal:       Right ankle: She exhibits decreased range of motion and swelling. Tenderness.  Neurological: She is alert and oriented to person, place, and time.  Skin: Skin is warm and dry. No rash noted.  Psychiatric: She has a normal mood and affect. Her behavior is normal. Thought content normal.    BP 132/78   Pulse 71   Ht 5\' 4"  (1.626 m)   Wt 189 lb (85.7 kg)   SpO2 96%   BMI 32.44 kg/m   Assessment and Plan: 1. Pseudogout involving multiple joints Likely CPPD  Pt instructed to use colchicine bid PRN flares Will refer to Rheumatology if recurrent - colchicine 0.6 MG tablet; Take 1 tablet (0.6 mg total) by mouth 2 (two) times daily as needed (gout).  Dispense: 40 tablet; Refill: 0   Meds ordered this encounter  Medications  . colchicine 0.6 MG tablet    Sig: Take 1 tablet (0.6 mg total) by mouth 2 (two) times daily as needed (gout).    Dispense:  40 tablet    Refill:  0    Partially dictated using . Any errors are unintentional.  Animal nutritionist, MD [facility]  11/16/2017

## 2017-11-23 DIAGNOSIS — G894 Chronic pain syndrome: Secondary | ICD-10-CM | POA: Diagnosis not present

## 2017-11-27 DIAGNOSIS — M79605 Pain in left leg: Secondary | ICD-10-CM | POA: Diagnosis not present

## 2017-11-27 DIAGNOSIS — M79604 Pain in right leg: Secondary | ICD-10-CM | POA: Diagnosis not present

## 2017-11-27 DIAGNOSIS — M5126 Other intervertebral disc displacement, lumbar region: Secondary | ICD-10-CM | POA: Diagnosis not present

## 2017-12-01 DIAGNOSIS — M1711 Unilateral primary osteoarthritis, right knee: Secondary | ICD-10-CM | POA: Diagnosis not present

## 2017-12-14 DIAGNOSIS — M5126 Other intervertebral disc displacement, lumbar region: Secondary | ICD-10-CM | POA: Diagnosis not present

## 2017-12-14 DIAGNOSIS — G894 Chronic pain syndrome: Secondary | ICD-10-CM | POA: Diagnosis not present

## 2017-12-20 ENCOUNTER — Telehealth: Payer: Self-pay

## 2017-12-20 ENCOUNTER — Other Ambulatory Visit: Payer: Self-pay | Admitting: Internal Medicine

## 2017-12-20 MED ORDER — PREDNISONE 10 MG PO TABS: ORAL_TABLET | ORAL | 0 refills | Status: DC

## 2017-12-20 NOTE — Telephone Encounter (Signed)
Patient called stating she was having gout flare ups in the left hand and knee and is currently experiencing pain. She is currently taking Colchicine 0.6 mg and would like to know if she could have something else for her gout.

## 2017-12-20 NOTE — Telephone Encounter (Signed)
I sent in a prednisone taper.  That is all there is otherwise to do.  She may need to see a Rheumatologist if she continues to have flares.

## 2017-12-20 NOTE — Telephone Encounter (Signed)
Unable to leave message

## 2017-12-22 NOTE — Telephone Encounter (Signed)
Patient notified

## 2017-12-26 ENCOUNTER — Telehealth: Payer: Self-pay | Admitting: Internal Medicine

## 2017-12-26 NOTE — Telephone Encounter (Signed)
Patient is requesting another medication to prevent inflammation for gout to be taken as an  everyday pill-please advise

## 2017-12-27 ENCOUNTER — Other Ambulatory Visit: Payer: Self-pay | Admitting: Internal Medicine

## 2017-12-27 DIAGNOSIS — M1189 Other specified crystal arthropathies, multiple sites: Secondary | ICD-10-CM

## 2017-12-27 MED ORDER — COLCHICINE 0.6 MG PO TABS
0.6000 mg | ORAL_TABLET | Freq: Two times a day (BID) | ORAL | 1 refills | Status: DC | PRN
Start: 2017-12-27 — End: 2019-10-04

## 2017-12-27 NOTE — Telephone Encounter (Signed)
Please handle

## 2017-12-27 NOTE — Telephone Encounter (Signed)
The only preventative for pseudogout is colchicine 0.6 mg twice a day - new Rx sent.  I also referred to Rheumatology to see if anything else can or needs to be done.

## 2017-12-27 NOTE — Telephone Encounter (Signed)
Patient was advised she will try bid and if she has diarrhea she needs to go back to taking it daily. Patient has appt in October 2019 with Rheumatologist.

## 2018-01-08 DIAGNOSIS — M1711 Unilateral primary osteoarthritis, right knee: Secondary | ICD-10-CM | POA: Diagnosis not present

## 2018-01-11 ENCOUNTER — Telehealth: Payer: Self-pay

## 2018-01-11 NOTE — Telephone Encounter (Signed)
Called patient back after VM left stating she needs refill on Nystatin mouthwash for Thrush. I advised she would need seen as PCP is on vacation. Patient is in bad shape with Gout and can hardly get off couch and when she does it is for the diarrhea she gets from the Gout meds. She said she will have to deal with thrush for another few days on top of other issues unless we can call in a one time dose for this thrush. Let me know and I can refill if need to [facility].

## 2018-01-12 NOTE — Telephone Encounter (Signed)
Looks like she has had 2 meds called in without seeing. She will need to schedule appt with Dr Judithann Graves when she returns or we can see her in office/ offer an appt.

## 2018-01-12 NOTE — Telephone Encounter (Signed)
Tried to call back and let ehr know that she needs appt but no answer.

## 2018-01-15 DIAGNOSIS — M1120 Other chondrocalcinosis, unspecified site: Secondary | ICD-10-CM | POA: Insufficient documentation

## 2018-01-15 DIAGNOSIS — M0579 Rheumatoid arthritis with rheumatoid factor of multiple sites without organ or systems involvement: Secondary | ICD-10-CM | POA: Insufficient documentation

## 2018-01-15 DIAGNOSIS — M069 Rheumatoid arthritis, unspecified: Secondary | ICD-10-CM | POA: Insufficient documentation

## 2018-01-15 DIAGNOSIS — M2550 Pain in unspecified joint: Secondary | ICD-10-CM | POA: Diagnosis not present

## 2018-01-15 DIAGNOSIS — R229 Localized swelling, mass and lump, unspecified: Secondary | ICD-10-CM | POA: Diagnosis not present

## 2018-01-15 DIAGNOSIS — R768 Other specified abnormal immunological findings in serum: Secondary | ICD-10-CM | POA: Diagnosis not present

## 2018-01-15 NOTE — Telephone Encounter (Signed)
Pt called today saying she did not get a call back on Friday. Asher Muir documented the call back "with no answer" to schedule an appt with Dr Yetta Barre for pt. I called pt today and told her that bit of information along with the fact that I had called one of her numbers first and the message was, "the mailbox is full and cannot accept your call". I explained to her that Asher Muir may have tried that number, but I did reach her on the second number listed. I offered an ppt to see Dr Yetta Barre and she turned it down stating, "That's fine I'll just call back when Dr Judithann Graves is there on Wednesday."

## 2018-01-22 DIAGNOSIS — I8392 Asymptomatic varicose veins of left lower extremity: Secondary | ICD-10-CM | POA: Diagnosis not present

## 2018-01-22 DIAGNOSIS — M7122 Synovial cyst of popliteal space [Baker], left knee: Secondary | ICD-10-CM | POA: Diagnosis not present

## 2018-02-16 DIAGNOSIS — M1711 Unilateral primary osteoarthritis, right knee: Secondary | ICD-10-CM | POA: Diagnosis not present

## 2018-02-27 DIAGNOSIS — Z5181 Encounter for therapeutic drug level monitoring: Secondary | ICD-10-CM | POA: Diagnosis not present

## 2018-02-27 DIAGNOSIS — I5022 Chronic systolic (congestive) heart failure: Secondary | ICD-10-CM | POA: Diagnosis not present

## 2018-02-27 DIAGNOSIS — I10 Essential (primary) hypertension: Secondary | ICD-10-CM | POA: Diagnosis not present

## 2018-03-06 DIAGNOSIS — M5126 Other intervertebral disc displacement, lumbar region: Secondary | ICD-10-CM | POA: Diagnosis not present

## 2018-03-06 DIAGNOSIS — G894 Chronic pain syndrome: Secondary | ICD-10-CM | POA: Diagnosis not present

## 2018-03-13 ENCOUNTER — Encounter: Payer: Self-pay | Admitting: Internal Medicine

## 2018-03-13 ENCOUNTER — Ambulatory Visit (INDEPENDENT_AMBULATORY_CARE_PROVIDER_SITE_OTHER): Payer: Medicare Other | Admitting: Internal Medicine

## 2018-03-13 VITALS — BP 132/72 | HR 88 | Temp 97.7°F | Ht 64.0 in | Wt 184.0 lb

## 2018-03-13 DIAGNOSIS — I10 Essential (primary) hypertension: Secondary | ICD-10-CM

## 2018-03-13 DIAGNOSIS — J45901 Unspecified asthma with (acute) exacerbation: Secondary | ICD-10-CM | POA: Diagnosis not present

## 2018-03-13 MED ORDER — HYDROCOD POLST-CPM POLST ER 10-8 MG/5ML PO SUER: 5.0000 mL | Freq: Two times a day (BID) | ORAL | 0 refills | Status: AC

## 2018-03-13 MED ORDER — AZITHROMYCIN 250 MG PO TABS
ORAL_TABLET | ORAL | 0 refills | Status: AC
Start: 2018-03-13 — End: 2018-03-19

## 2018-03-13 MED ORDER — FUROSEMIDE 20 MG PO TABS: 20.0000 mg | ORAL_TABLET | Freq: Every day | ORAL | 0 refills | Status: DC | PRN

## 2018-03-13 NOTE — Progress Notes (Signed)
Date:  03/13/2018   Name:  Hayley Ewing   DOB:  09-14-1945   MRN:  [id number]   Chief Complaint: Cough (Started 2 days ago. Cough last night was so bad she could not sleep. Hacking cough- Starts and won't stop. White production.) Cough  This is a new problem. The current episode started in the past 7 days. The problem has been unchanged. The problem occurs every few minutes. The cough is non-productive. Associated symptoms include shortness of breath and wheezing. Pertinent negatives include no chest pain, chills or fever.     Review of Systems  Constitutional: Positive for fatigue. Negative for chills, fever and unexpected weight change.  Respiratory: Positive for cough, chest tightness, shortness of breath and wheezing.   Cardiovascular: Negative for chest pain, palpitations and leg swelling.    Patient Active Problem List   Diagnosis Date Noted  . Pseudogout involving multiple joints 11/16/2017  . Joint inflammation of left hand and wrist 11/07/2017  . Weakness of both lower extremities 11/03/2017  . Colitis 08/31/2017  . Essential hypertension 08/10/2017  . Systolic dysfunction 08/10/2017  . Asthma, persistent controlled 07/10/2017  . Left ventricular enlargement 06/22/2017  . Disorder of bursae of shoulder region 01/30/2017  . Displacement of lumbar intervertebral disc without myelopathy 01/30/2017  . Venous insufficiency of both lower extremities 06/14/2016  . Bladder cystocele 05/19/2015  . Degenerative arthritis of hip 05/19/2015    No Known Allergies  Past Surgical History:  Procedure Laterality Date  . TOTAL ABDOMINAL HYSTERECTOMY  1998  . TOTAL HIP ARTHROPLASTY Right 2014    Social History   Tobacco Use  . Smoking status: Never Smoker  . Smokeless tobacco: Never Used  . Tobacco comment: smoking cessation materials not required  Substance Use Topics  . Alcohol use: Not Currently  . Drug use: No     Medication list has been reviewed and  updated.  Current Meds  Medication Sig  . albuterol (PROVENTIL) (2.5 MG/3ML) 0.083% nebulizer solution Take 3 mLs (2.5 mg total) by nebulization every 6 (six) hours as needed for wheezing or shortness of breath.  . budesonide-formoterol (SYMBICORT) 80-4.5 MCG/ACT inhaler Inhale 2 puffs into the lungs 2 (two) times daily.  . colchicine 0.6 MG tablet Take 1 tablet (0.6 mg total) by mouth 2 (two) times daily as needed (gout).  . furosemide (LASIX) 20 MG tablet Take 1 tablet by mouth daily.  Marland Kitchen HYDROcodone-acetaminophen (NORCO) 10-325 MG tablet Take 1 tablet by mouth 2 (two) times daily.   Marland Kitchen ibuprofen (ADVIL,MOTRIN) 600 MG tablet Take 600 mg by mouth 2 (two) times daily.  Marland Kitchen lidocaine (LIDODERM) 5 % Place 1 patch onto the skin daily. Remove & Discard patch within 12 hours or as directed by MD  . lisinopril (PRINIVIL,ZESTRIL) 5 MG tablet Take 1 tablet by mouth daily.  . metoprolol succinate (TOPROL-XL) 25 MG 24 hr tablet Take 25 mg by mouth daily.  . predniSONE (DELTASONE) 10 MG tablet Take 6 on day 1, 5 on day 2, 4 on day 3, 3 on day 4, 2 on day 5 and 1 on day 1 then stop.  Marland Kitchen spironolactone (ALDACTONE) 25 MG tablet Take 0.5 tablets by mouth daily.   Current Facility-Administered Medications for the 03/13/18 encounter (Office Visit) with Reubin Milan, MD  Medication  . albuterol (PROVENTIL) (2.5 MG/3ML) 0.083% nebulizer solution 2.5 mg    PHQ 2/9 Scores 11/09/2017 11/07/2017 11/01/2016 10/12/2016  PHQ - 2 Score 6 0 0 0  PHQ- 9 Score  8 0 - -    Physical Exam  Constitutional: She is oriented to person, place, and time. She appears well-developed. No distress.  HENT:  Head: Normocephalic and atraumatic.  Neck: Normal range of motion. Neck supple.  Cardiovascular: Normal rate, regular rhythm and normal heart sounds.  Pulmonary/Chest: Effort normal. No respiratory distress. She has wheezes. She has no rales.  Musculoskeletal: She exhibits no edema.  Lymphadenopathy:    She has no cervical  adenopathy.  Neurological: She is alert and oriented to person, place, and time.  Skin: Skin is warm and dry. No rash noted.  Psychiatric: She has a normal mood and affect. Her behavior is normal. Thought content normal.  Nursing note and vitals reviewed.   BP (!) 148/84 (BP Location: Right Arm, Patient Position: Sitting, Cuff Size: Normal)   Pulse 88   Temp 97.7 F (36.5 C) (Oral)   Ht 5\' 4"  (1.626 m)   Wt 184 lb (83.5 kg)   SpO2 99%   BMI 31.58 kg/m   Assessment and Plan: 1. Mild asthma with exacerbation, unspecified whether persistent Resume symbicort, use nebulizer at HS - azithromycin (ZITHROMAX Z-PAK) 250 MG tablet; UAD  Dispense: 6 each; Refill: 0 - chlorpheniramine-HYDROcodone (TUSSIONEX PENNKINETIC ER) 10-8 MG/5ML SUER; Take 5 mLs by mouth 2 (two) times daily for 10 days.  Dispense: 115 mL; Refill: 0  2. Essential hypertension - furosemide (LASIX) 20 MG tablet; Take 1 tablet (20 mg total) by mouth daily as needed.  Dispense: 30 tablet; Refill: 0   Meds ordered this encounter  Medications  . azithromycin (ZITHROMAX Z-PAK) 250 MG tablet    Sig: UAD    Dispense:  6 each    Refill:  0  . chlorpheniramine-HYDROcodone (TUSSIONEX PENNKINETIC ER) 10-8 MG/5ML SUER    Sig: Take 5 mLs by mouth 2 (two) times daily for 10 days.    Dispense:  115 mL    Refill:  0  . furosemide (LASIX) 20 MG tablet    Sig: Take 1 tablet (20 mg total) by mouth daily as needed.    Dispense:  30 tablet    Refill:  0    Partially dictated using . Any errors are unintentional.  Animal nutritionist, MD [facility]  03/13/2018  There are no diagnoses linked to this encounter.

## 2018-03-16 DIAGNOSIS — I10 Essential (primary) hypertension: Secondary | ICD-10-CM | POA: Diagnosis not present

## 2018-05-30 IMAGING — CR DG HIP (WITH OR WITHOUT PELVIS) 2-3V*R*
4 series · 4 of 4 positions shown · non-contrast
Comparison: None.

CLINICAL DATA: Right hip pain after fall

EXAM:
DG HIP (WITH OR WITHOUT PELVIS) 2-3V RIGHT

[pelvis ap (1 of 2)]
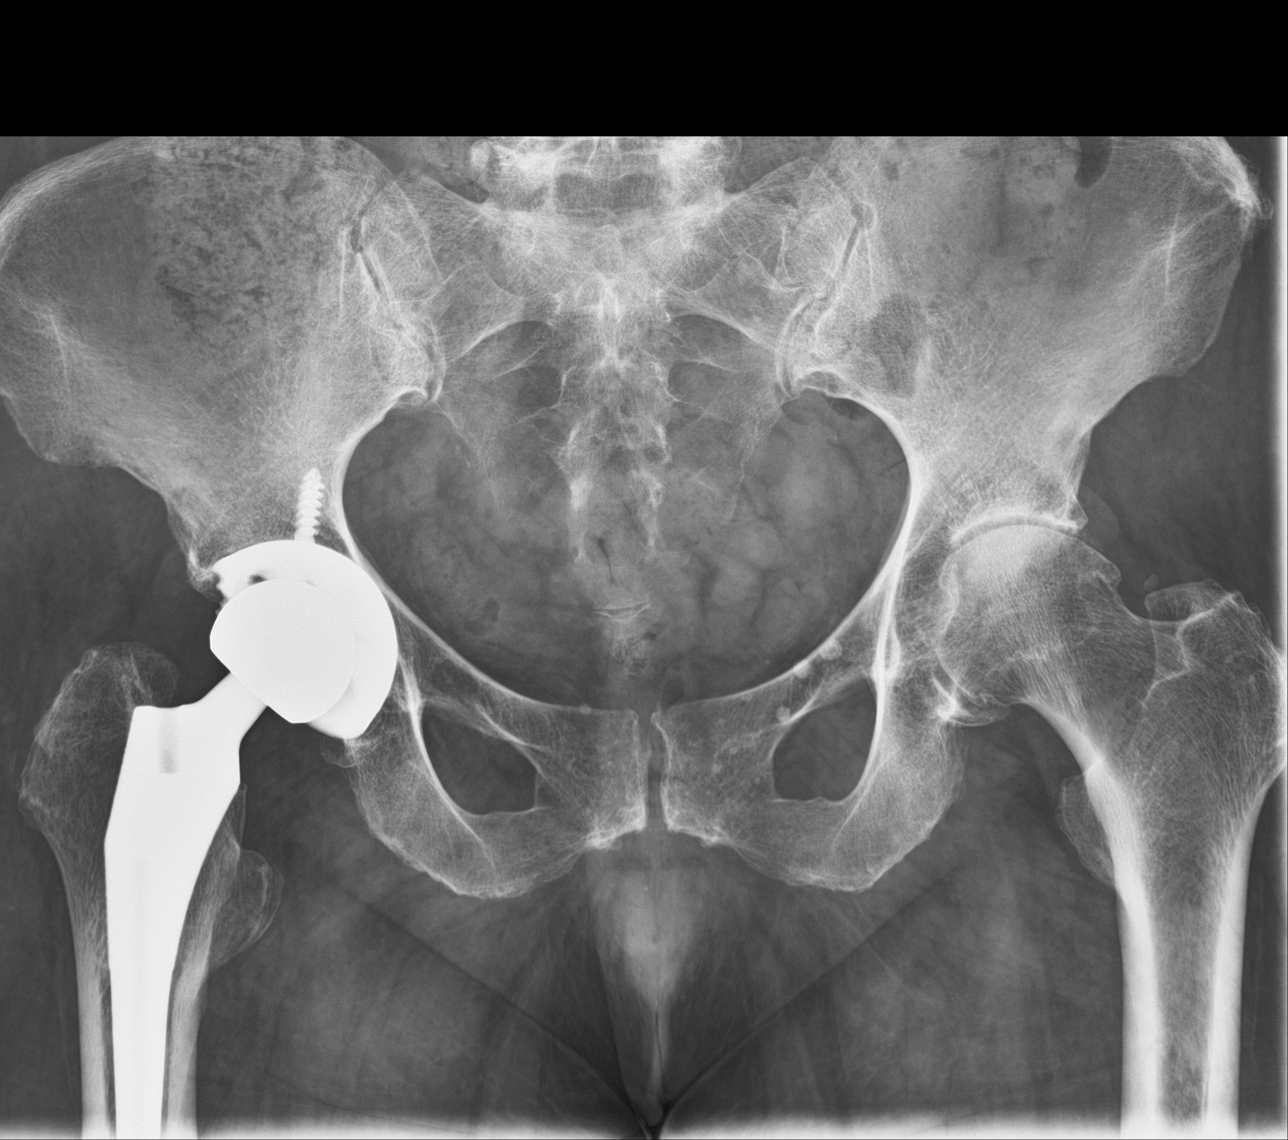

[hip ap]
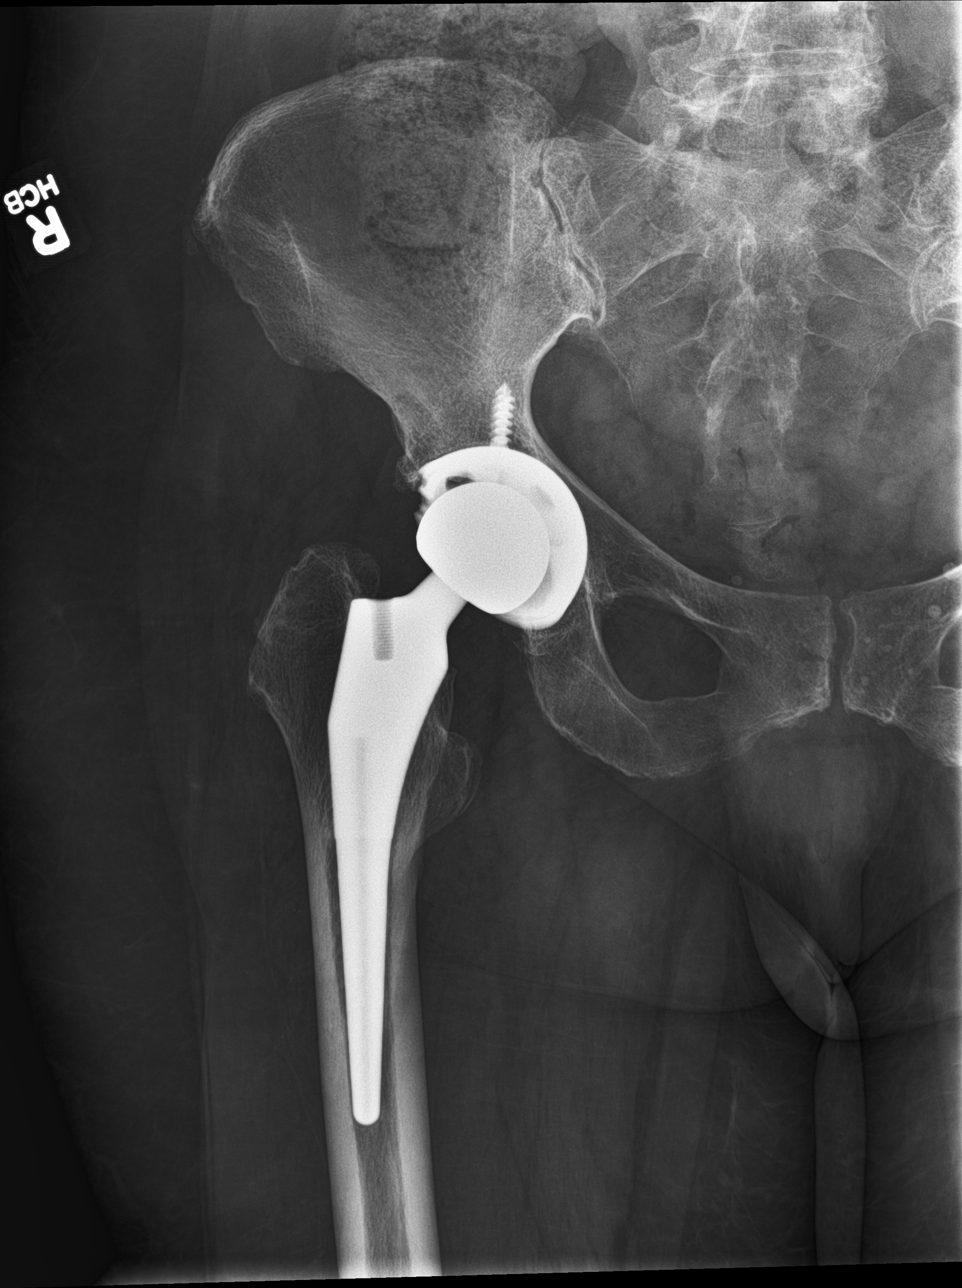

[hip lat]
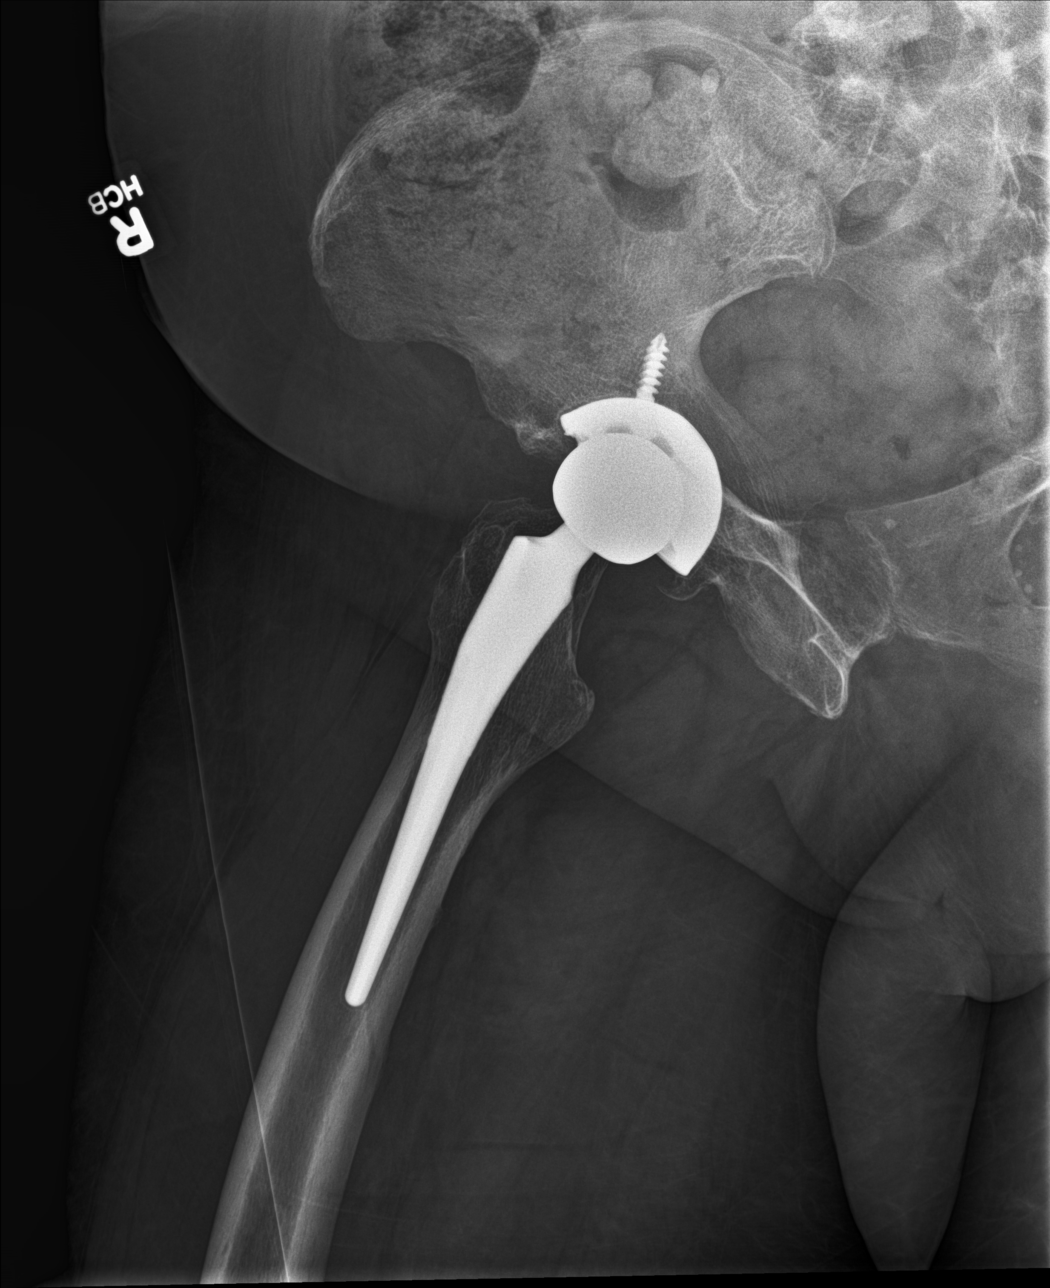

[pelvis ap (2 of 2)]
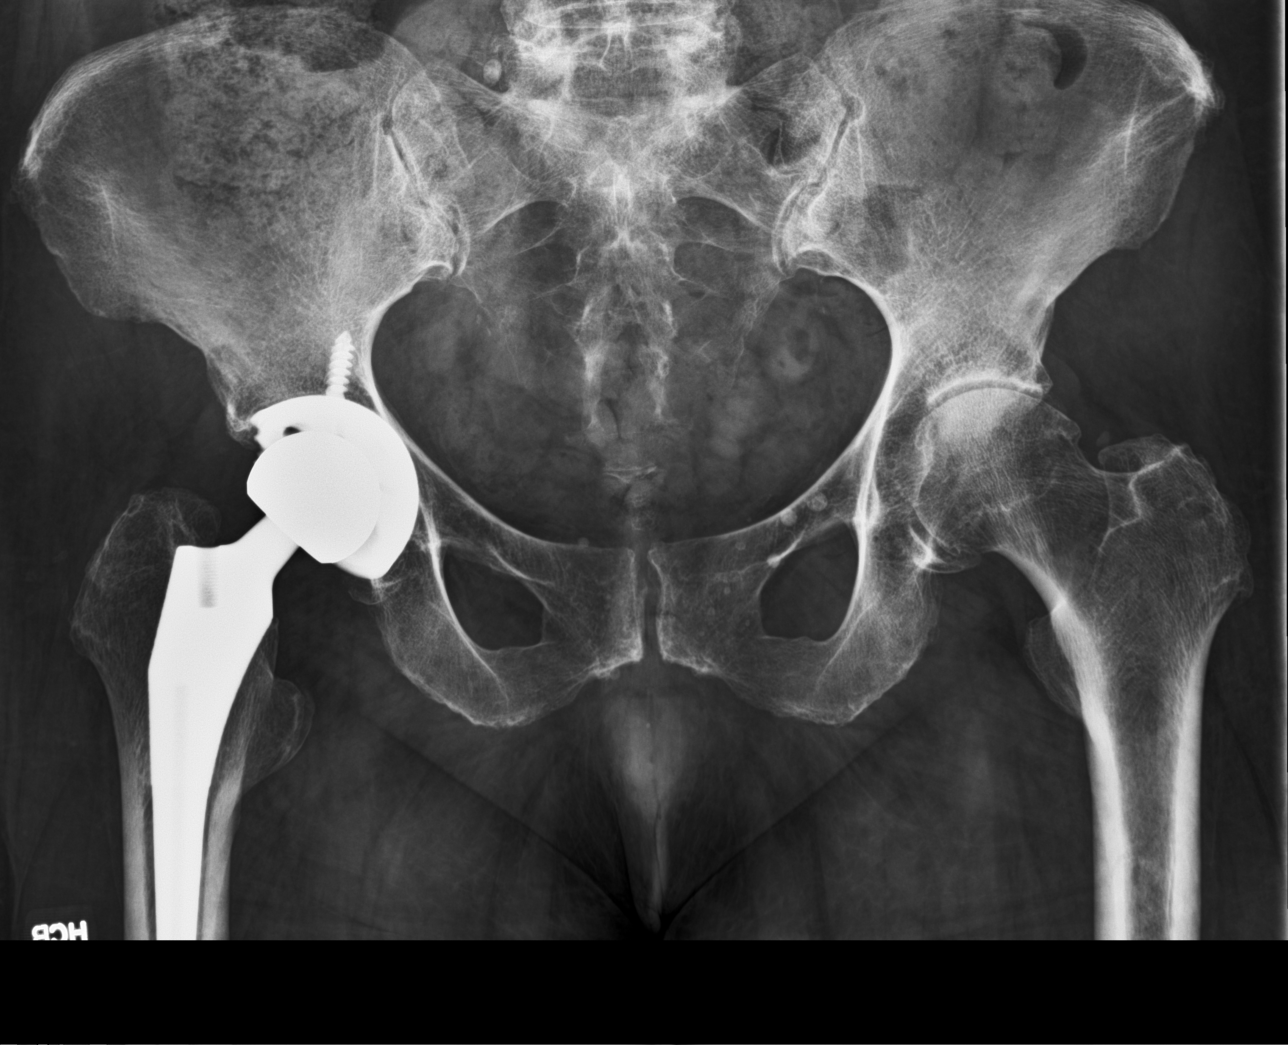

[4 of 4 positions shown; findings below may reference images not displayed]

FINDINGS: SI joints are grossly symmetric. Patient is status post right hip
replacement with intact hardware. No dislocation evident. No
fracture.

Pubic symphysis is intact. Moderate to marked arthritis left hip
with joint space narrowing and sclerosis. Femoral head neck
osteophyte.
IMPRESSION: Status post right hip replacement.  No acute osseous abnormality

## 2018-05-31 DIAGNOSIS — G894 Chronic pain syndrome: Secondary | ICD-10-CM | POA: Diagnosis not present

## 2018-05-31 DIAGNOSIS — M19019 Primary osteoarthritis, unspecified shoulder: Secondary | ICD-10-CM | POA: Diagnosis not present

## 2018-05-31 DIAGNOSIS — Z79891 Long term (current) use of opiate analgesic: Secondary | ICD-10-CM | POA: Diagnosis not present

## 2018-05-31 DIAGNOSIS — Z79899 Other long term (current) drug therapy: Secondary | ICD-10-CM | POA: Diagnosis not present

## 2018-05-31 DIAGNOSIS — M5126 Other intervertebral disc displacement, lumbar region: Secondary | ICD-10-CM | POA: Diagnosis not present

## 2018-07-23 ENCOUNTER — Ambulatory Visit (INDEPENDENT_AMBULATORY_CARE_PROVIDER_SITE_OTHER): Payer: Medicare Other | Admitting: Internal Medicine

## 2018-07-23 ENCOUNTER — Encounter: Payer: Self-pay | Admitting: Internal Medicine

## 2018-07-23 VITALS — BP 138/80 | HR 77 | Ht 64.0 in | Wt 184.0 lb

## 2018-07-23 DIAGNOSIS — K529 Noninfective gastroenteritis and colitis, unspecified: Secondary | ICD-10-CM | POA: Diagnosis not present

## 2018-07-23 DIAGNOSIS — K625 Hemorrhage of anus and rectum: Secondary | ICD-10-CM | POA: Diagnosis not present

## 2018-07-23 MED ORDER — CIPROFLOXACIN HCL 500 MG PO TABS
500.0000 mg | ORAL_TABLET | Freq: Two times a day (BID) | ORAL | 0 refills | Status: AC
Start: 2018-07-23 — End: 2018-07-30

## 2018-07-23 MED ORDER — METRONIDAZOLE 500 MG PO TABS: 500.0000 mg | ORAL_TABLET | Freq: Three times a day (TID) | ORAL | 0 refills | Status: AC

## 2018-07-23 NOTE — Progress Notes (Signed)
Date:  07/23/2018   Name:  Hayley Ewing   DOB:  [birth year]   MRN:  [id number]   Chief Complaint: Abdominal Pain (Had a spell of blood in stool for 2 days. [place] Spaniel it was not only when she wiped but also wrapped around stool in toilet. Has not had blood since then. Stomach cramp are on and off. Had 4 spells of cramps yesterday. Pain is located in lower front stomach.  )  Abdominal Pain  This is a new problem. The current episode started in the past 7 days. The problem occurs intermittently. The quality of the pain is colicky and cramping. Associated symptoms include diarrhea (and bright red blood). Pertinent negatives include no dysuria, fever or headaches.    Review of Systems  Constitutional: Negative for chills, fatigue and fever.  Respiratory: Negative for cough, shortness of breath and wheezing.   Cardiovascular: Negative for chest pain, palpitations and leg swelling.  Gastrointestinal: Positive for abdominal pain (cramping) and diarrhea (and bright red blood).  Genitourinary: Negative for dysuria.  Neurological: Negative for dizziness, syncope, light-headedness and headaches.    Patient Active Problem List   Diagnosis Date Noted  . Pseudogout involving multiple joints 11/16/2017  . Joint inflammation of left hand and wrist 11/07/2017  . Weakness of both lower extremities 11/03/2017  . Colitis 08/31/2017  . Essential hypertension 08/10/2017  . Systolic dysfunction 08/10/2017  . Asthma, persistent controlled 07/10/2017  . Left ventricular enlargement 06/22/2017  . Disorder of bursae of shoulder region 01/30/2017  . Displacement of lumbar intervertebral disc without myelopathy 01/30/2017  . Venous insufficiency of both lower extremities 06/14/2016  . Bladder cystocele 05/19/2015  . Degenerative arthritis of hip 05/19/2015    No Known Allergies  Past Surgical History:  Procedure Laterality Date  . TOTAL ABDOMINAL HYSTERECTOMY  1998  . TOTAL HIP ARTHROPLASTY Right 2014     Social History   Tobacco Use  . Smoking status: Never Smoker  . Smokeless tobacco: Never Used  . Tobacco comment: smoking cessation materials not required  Substance Use Topics  . Alcohol use: Not Currently  . Drug use: No     Medication list has been reviewed and updated.  Current Meds  Medication Sig  . albuterol (PROVENTIL) (2.5 MG/3ML) 0.083% nebulizer solution Take 3 mLs (2.5 mg total) by nebulization every 6 (six) hours as needed for wheezing or shortness of breath.  . budesonide-formoterol (SYMBICORT) 80-4.5 MCG/ACT inhaler Inhale 2 puffs into the lungs 2 (two) times daily.  . colchicine 0.6 MG tablet Take 1 tablet (0.6 mg total) by mouth 2 (two) times daily as needed (gout).  . furosemide (LASIX) 20 MG tablet Take 1 tablet (20 mg total) by mouth daily as needed.  Marland Kitchen HYDROcodone-acetaminophen (NORCO) 10-325 MG tablet Take 1 tablet by mouth 2 (two) times daily.   Marland Kitchen ibuprofen (ADVIL,MOTRIN) 600 MG tablet Take 600 mg by mouth 2 (two) times daily.  Marland Kitchen lidocaine (LIDODERM) 5 % Place 1 patch onto the skin daily. Remove & Discard patch within 12 hours or as directed by MD  . lisinopril (PRINIVIL,ZESTRIL) 5 MG tablet Take 1 tablet by mouth daily.  . metoprolol succinate (TOPROL-XL) 25 MG 24 hr tablet Take 25 mg by mouth daily.  . Misc Natural Products (TART CHERRY ADVANCED PO) Take by mouth.  . spironolactone (ALDACTONE) 25 MG tablet Take 0.5 tablets by mouth daily.   Current Facility-Administered Medications for the 07/23/18 encounter (Office Visit) with Reubin Milan, MD  Medication  . albuterol (  PROVENTIL) (2.5 MG/3ML) 0.083% nebulizer solution 2.5 mg    PHQ 2/9 Scores 11/09/2017 11/07/2017 11/01/2016 10/12/2016  PHQ - 2 Score 6 0 0 0  PHQ- 9 Score 8 0 - -    Physical Exam Vitals signs and nursing note reviewed.  Constitutional:      General: She is not in acute distress.    Appearance: She is well-developed. She is not toxic-appearing.  HENT:     Head: Normocephalic  and atraumatic.  Cardiovascular:     Rate and Rhythm: Normal rate and regular rhythm.     Pulses: Normal pulses.     Heart sounds: Normal heart sounds.  Pulmonary:     Effort: Pulmonary effort is normal. No respiratory distress.  Abdominal:     General: Bowel sounds are normal.     Palpations: Abdomen is soft.     Tenderness: There is abdominal tenderness in the left lower quadrant.  Musculoskeletal: Normal range of motion.  Skin:    General: Skin is warm and dry.     Findings: No rash.  Neurological:     Mental Status: She is alert and oriented to person, place, and time.  Psychiatric:        Behavior: Behavior normal.        Thought Content: Thought content normal.     BP 138/80 (BP Location: Left Arm, Patient Position: Sitting, Cuff Size: Normal)   Pulse 77   Ht 5\' 4"  (1.626 m)   Wt 184 lb (83.5 kg)   SpO2 98%   BMI 31.58 kg/m   Assessment and Plan: 1. Colitis with rectal bleeding Continue bland diet Contact [facility] to schedule colonoscopy due in February - ciprofloxacin (CIPRO) 500 MG tablet; Take 1 tablet (500 mg total) by mouth 2 (two) times daily for 7 days.  Dispense: 14 tablet; Refill: 0 - metroNIDAZOLE (FLAGYL) 500 MG tablet; Take 1 tablet (500 mg total) by mouth 3 (three) times daily for 7 days.  Dispense: 21 tablet; Refill: 0   Partially dictated using March. Any errors are unintentional.  Animal nutritionist, MD [facility]  07/23/2018

## 2018-07-26 DIAGNOSIS — M25561 Pain in right knee: Secondary | ICD-10-CM | POA: Diagnosis not present

## 2018-07-30 ENCOUNTER — Telehealth: Payer: Self-pay | Admitting: Internal Medicine

## 2018-07-30 NOTE — Telephone Encounter (Signed)
If she is feeling better, I do not think she needs additional medication at this time.  Give herself a few days to see if her stool will return to normal.  If not, will prescribe another round of antibiotics or send to GI for evaluation.

## 2018-07-30 NOTE — Telephone Encounter (Signed)
Patient called and left Vm stating she is finishing her last Abx today. Her stool is still like "jelly" and has not gotten back to normal. Wants to know should she do different abx?  Please Advise.

## 2018-07-30 NOTE — Telephone Encounter (Signed)
Left a message earlier and says her cell phone is not charging so if you could call her back at [phone]

## 2018-07-31 ENCOUNTER — Other Ambulatory Visit: Payer: Self-pay | Admitting: Internal Medicine

## 2018-07-31 MED ORDER — NYSTATIN 100000 UNIT/ML MT SUSP: 5.0000 mL | Freq: Four times a day (QID) | OROMUCOSAL | 0 refills | Status: DC

## 2018-07-31 NOTE — Telephone Encounter (Signed)
Rx for nystatin mouth wash sent to pharmacy of record.

## 2018-07-31 NOTE — Telephone Encounter (Signed)
Tried calling patient and informing. Unable to leave VM.

## 2018-07-31 NOTE — Telephone Encounter (Signed)
Patient informed. She said she will call by the end of the week if stool is not back to normal. Also, she said after finishing antibiotics she now has thrush on her tongue. This has happened in the past.'  Please Advise.

## 2018-08-01 NOTE — Telephone Encounter (Signed)
Called and spoke with patient husband. Patient has already picked up Rx.

## 2018-08-21 ENCOUNTER — Ambulatory Visit (INDEPENDENT_AMBULATORY_CARE_PROVIDER_SITE_OTHER): Payer: Medicare Other | Admitting: Internal Medicine

## 2018-08-21 ENCOUNTER — Encounter: Payer: Self-pay | Admitting: Internal Medicine

## 2018-08-21 ENCOUNTER — Other Ambulatory Visit: Payer: Self-pay

## 2018-08-21 VITALS — BP 120/68 | HR 91 | Ht 64.0 in | Wt 184.0 lb

## 2018-08-21 DIAGNOSIS — N3001 Acute cystitis with hematuria: Secondary | ICD-10-CM

## 2018-08-21 LAB — POC URINALYSIS WITH MICROSCOPIC (NON AUTO)MANUAL RESULT
Bilirubin, UA: NEGATIVE
Crystals: 0
Epithelial cells, urine per micros: 5
Glucose, UA: NEGATIVE
KETONES UA: NEGATIVE
Mucus, UA: 0
Nitrite, UA: NEGATIVE
PH UA: 6 (ref 5.0–8.0)
Protein, UA: NEGATIVE
RBC: 3 M/uL — AB (ref 4.04–5.48)
Spec Grav, UA: 1.01 (ref 1.010–1.025)
Urobilinogen, UA: 0.2 E.U./dL
WBC Casts, UA: 5

## 2018-08-21 MED ORDER — CIPROFLOXACIN HCL 250 MG PO TABS: 250.0000 mg | ORAL_TABLET | Freq: Two times a day (BID) | ORAL | 0 refills | Status: AC

## 2018-08-21 NOTE — Progress Notes (Signed)
Date:  08/21/2018   Name:  Hayley Ewing   DOB:  02-21-1946   MRN:  [id number]   Chief Complaint: Urinary Tract Infection (Pelvic pressure and pain. Started yesterday, When sitting down feels "off")  Urinary Tract Infection   This is a new problem. The current episode started yesterday. The problem occurs every urination. The problem has been gradually worsening. The quality of the pain is described as burning. The pain is mild. There has been no fever. Associated symptoms include frequency and urgency. Pertinent negatives include no chills, hematuria, nausea or vomiting. She has tried nothing for the symptoms.    Review of Systems  Constitutional: Negative for chills, fatigue and fever.  HENT: Positive for dental problem.   Respiratory: Negative for chest tightness, shortness of breath and wheezing.   Cardiovascular: Negative for chest pain, palpitations and leg swelling.  Gastrointestinal: Negative for diarrhea, nausea and vomiting.  Genitourinary: Positive for frequency and urgency. Negative for hematuria.  Neurological: Negative for dizziness and headaches.    Patient Active Problem List   Diagnosis Date Noted  . Pseudogout involving multiple joints 11/16/2017  . Joint inflammation of left hand and wrist 11/07/2017  . Weakness of both lower extremities 11/03/2017  . Colitis 08/31/2017  . Essential hypertension 08/10/2017  . Systolic dysfunction 08/10/2017  . Asthma, persistent controlled 07/10/2017  . Left ventricular enlargement 06/22/2017  . Disorder of bursae of shoulder region 01/30/2017  . Displacement of lumbar intervertebral disc without myelopathy 01/30/2017  . Venous insufficiency of both lower extremities 06/14/2016  . Bladder cystocele 05/19/2015  . Degenerative arthritis of hip 05/19/2015    No Known Allergies  Past Surgical History:  Procedure Laterality Date  . TOTAL ABDOMINAL HYSTERECTOMY  1998  . TOTAL HIP ARTHROPLASTY Right 2014    Social History    Tobacco Use  . Smoking status: Never Smoker  . Smokeless tobacco: Never Used  . Tobacco comment: smoking cessation materials not required  Substance Use Topics  . Alcohol use: Not Currently  . Drug use: No     Medication list has been reviewed and updated.  Current Meds  Medication Sig  . albuterol (PROVENTIL) (2.5 MG/3ML) 0.083% nebulizer solution Take 3 mLs (2.5 mg total) by nebulization every 6 (six) hours as needed for wheezing or shortness of breath.  . budesonide-formoterol (SYMBICORT) 80-4.5 MCG/ACT inhaler Inhale 2 puffs into the lungs 2 (two) times daily.  . colchicine 0.6 MG tablet Take 1 tablet (0.6 mg total) by mouth 2 (two) times daily as needed (gout).  . furosemide (LASIX) 20 MG tablet Take 1 tablet (20 mg total) by mouth daily as needed.  Marland Kitchen HYDROcodone-acetaminophen (NORCO) 10-325 MG tablet Take 1 tablet by mouth 2 (two) times daily.   Marland Kitchen ibuprofen (ADVIL,MOTRIN) 600 MG tablet Take 600 mg by mouth 2 (two) times daily.  Marland Kitchen lidocaine (LIDODERM) 5 % Place 1 patch onto the skin daily. Remove & Discard patch within 12 hours or as directed by MD  . metoprolol succinate (TOPROL-XL) 25 MG 24 hr tablet Take 25 mg by mouth daily.  . Misc Natural Products (TART CHERRY ADVANCED PO) Take by mouth.  . nystatin (MYCOSTATIN) 100000 UNIT/ML suspension Take 5 mLs (500,000 Units total) by mouth 4 (four) times daily. Swish, gargle and spit  . spironolactone (ALDACTONE) 25 MG tablet Take 0.5 tablets by mouth daily.   Current Facility-Administered Medications for the 08/21/18 encounter (Office Visit) with Reubin Milan, MD  Medication  . albuterol (PROVENTIL) (2.5 MG/3ML) 0.083%  nebulizer solution 2.5 mg    PHQ 2/9 Scores 08/21/2018 11/09/2017 11/07/2017 11/01/2016  PHQ - 2 Score 0 6 0 0  PHQ- 9 Score - 8 0 -    Physical Exam Vitals signs and nursing note reviewed.  Constitutional:      Appearance: She is well-developed.  Cardiovascular:     Rate and Rhythm: Normal rate and  regular rhythm.     Heart sounds: Normal heart sounds.  Pulmonary:     Effort: Pulmonary effort is normal. No respiratory distress.     Breath sounds: Normal breath sounds.  Abdominal:     General: Bowel sounds are normal.     Palpations: Abdomen is soft.     Tenderness: There is abdominal tenderness in the suprapubic area. There is no guarding or rebound.     BP 120/68   Pulse 91   Ht 5\' 4"  (1.626 m)   Wt 184 lb (83.5 kg)   SpO2 99%   BMI 31.58 kg/m   Assessment and Plan: 1. Acute cystitis with hematuria Continue increased fluids - POC urinalysis w microscopic (non auto) - ciprofloxacin (CIPRO) 250 MG tablet; Take 1 tablet (250 mg total) by mouth 2 (two) times daily for 3 days.  Dispense: 6 tablet; Refill: 0   Partially dictated using Animal nutritionist. Any errors are unintentional.  Bari Edward, MD [facility]  08/21/2018

## 2018-08-24 ENCOUNTER — Encounter: Payer: Medicare Other | Admitting: Internal Medicine

## 2018-08-28 DIAGNOSIS — Z1231 Encounter for screening mammogram for malignant neoplasm of breast: Secondary | ICD-10-CM | POA: Diagnosis not present

## 2018-08-30 DIAGNOSIS — M5126 Other intervertebral disc displacement, lumbar region: Secondary | ICD-10-CM | POA: Diagnosis not present

## 2018-08-30 DIAGNOSIS — G894 Chronic pain syndrome: Secondary | ICD-10-CM | POA: Diagnosis not present

## 2018-08-30 DIAGNOSIS — M19019 Primary osteoarthritis, unspecified shoulder: Secondary | ICD-10-CM | POA: Diagnosis not present

## 2018-08-30 DIAGNOSIS — Z79899 Other long term (current) drug therapy: Secondary | ICD-10-CM | POA: Diagnosis not present

## 2018-10-02 DIAGNOSIS — J40 Bronchitis, not specified as acute or chronic: Secondary | ICD-10-CM | POA: Diagnosis not present

## 2018-10-02 DIAGNOSIS — R062 Wheezing: Secondary | ICD-10-CM | POA: Diagnosis not present

## 2018-10-02 DIAGNOSIS — J9801 Acute bronchospasm: Secondary | ICD-10-CM | POA: Diagnosis not present

## 2018-10-06 DIAGNOSIS — J209 Acute bronchitis, unspecified: Secondary | ICD-10-CM | POA: Diagnosis not present

## 2018-10-06 DIAGNOSIS — R05 Cough: Secondary | ICD-10-CM | POA: Diagnosis not present

## 2018-10-13 DIAGNOSIS — M1A3310 Chronic gout due to renal impairment, right wrist, without tophus (tophi): Secondary | ICD-10-CM | POA: Diagnosis not present

## 2018-10-13 DIAGNOSIS — M1A3110 Chronic gout due to renal impairment, right shoulder, without tophus (tophi): Secondary | ICD-10-CM | POA: Diagnosis not present

## 2018-10-13 DIAGNOSIS — M1A3210 Chronic gout due to renal impairment, right elbow, without tophus (tophi): Secondary | ICD-10-CM | POA: Diagnosis not present

## 2018-10-13 DIAGNOSIS — I10 Essential (primary) hypertension: Secondary | ICD-10-CM | POA: Diagnosis not present

## 2018-10-13 DIAGNOSIS — M1A9XX0 Chronic gout, unspecified, without tophus (tophi): Secondary | ICD-10-CM | POA: Diagnosis not present

## 2018-10-13 DIAGNOSIS — M1A3610 Chronic gout due to renal impairment, right knee, without tophus (tophi): Secondary | ICD-10-CM | POA: Diagnosis not present

## 2018-10-15 DIAGNOSIS — B370 Candidal stomatitis: Secondary | ICD-10-CM | POA: Diagnosis not present

## 2018-10-15 DIAGNOSIS — J029 Acute pharyngitis, unspecified: Secondary | ICD-10-CM | POA: Diagnosis not present

## 2018-10-21 DIAGNOSIS — M13861 Other specified arthritis, right knee: Secondary | ICD-10-CM | POA: Diagnosis not present

## 2018-10-21 DIAGNOSIS — I10 Essential (primary) hypertension: Secondary | ICD-10-CM | POA: Diagnosis not present

## 2018-10-21 DIAGNOSIS — M13841 Other specified arthritis, right hand: Secondary | ICD-10-CM | POA: Diagnosis not present

## 2018-10-21 DIAGNOSIS — Z8739 Personal history of other diseases of the musculoskeletal system and connective tissue: Secondary | ICD-10-CM | POA: Diagnosis not present

## 2018-10-21 DIAGNOSIS — M109 Gout, unspecified: Secondary | ICD-10-CM | POA: Diagnosis not present

## 2018-10-26 DIAGNOSIS — M19011 Primary osteoarthritis, right shoulder: Secondary | ICD-10-CM | POA: Diagnosis not present

## 2018-10-26 DIAGNOSIS — Z79899 Other long term (current) drug therapy: Secondary | ICD-10-CM | POA: Diagnosis not present

## 2018-10-26 DIAGNOSIS — M5126 Other intervertebral disc displacement, lumbar region: Secondary | ICD-10-CM | POA: Diagnosis not present

## 2018-10-26 DIAGNOSIS — G894 Chronic pain syndrome: Secondary | ICD-10-CM | POA: Diagnosis not present

## 2018-10-28 DIAGNOSIS — M25571 Pain in right ankle and joints of right foot: Secondary | ICD-10-CM | POA: Diagnosis not present

## 2018-10-28 DIAGNOSIS — M25531 Pain in right wrist: Secondary | ICD-10-CM | POA: Diagnosis not present

## 2018-10-28 DIAGNOSIS — I10 Essential (primary) hypertension: Secondary | ICD-10-CM | POA: Diagnosis not present

## 2018-10-28 DIAGNOSIS — M25541 Pain in joints of right hand: Secondary | ICD-10-CM | POA: Diagnosis not present

## 2018-10-28 DIAGNOSIS — M25572 Pain in left ankle and joints of left foot: Secondary | ICD-10-CM | POA: Diagnosis not present

## 2018-10-28 DIAGNOSIS — M25561 Pain in right knee: Secondary | ICD-10-CM | POA: Diagnosis not present

## 2018-11-11 DIAGNOSIS — M25531 Pain in right wrist: Secondary | ICD-10-CM | POA: Diagnosis not present

## 2018-11-11 DIAGNOSIS — D72829 Elevated white blood cell count, unspecified: Secondary | ICD-10-CM | POA: Diagnosis not present

## 2018-11-11 DIAGNOSIS — I10 Essential (primary) hypertension: Secondary | ICD-10-CM | POA: Diagnosis not present

## 2018-11-11 DIAGNOSIS — Z79899 Other long term (current) drug therapy: Secondary | ICD-10-CM | POA: Diagnosis not present

## 2018-11-11 DIAGNOSIS — M1990 Unspecified osteoarthritis, unspecified site: Secondary | ICD-10-CM | POA: Diagnosis not present

## 2018-11-11 DIAGNOSIS — G8929 Other chronic pain: Secondary | ICD-10-CM | POA: Diagnosis not present

## 2018-11-11 DIAGNOSIS — M25572 Pain in left ankle and joints of left foot: Secondary | ICD-10-CM | POA: Diagnosis not present

## 2018-11-12 ENCOUNTER — Ambulatory Visit: Payer: Medicare Other

## 2018-11-14 ENCOUNTER — Telehealth: Payer: Self-pay | Admitting: Internal Medicine

## 2018-11-14 NOTE — Telephone Encounter (Signed)
Called to RE-schedule Medicare Annual Wellness Visit with Nurse Health Advisor.   If patient returns call, please schedule AWV with NHA closer to CPE date  For any questions please contact:  Manuela Schwartz [phone] or skype at: [email or web address]

## 2018-11-17 DIAGNOSIS — M109 Gout, unspecified: Secondary | ICD-10-CM | POA: Diagnosis not present

## 2018-11-17 DIAGNOSIS — M1990 Unspecified osteoarthritis, unspecified site: Secondary | ICD-10-CM | POA: Diagnosis not present

## 2018-11-17 DIAGNOSIS — M25562 Pain in left knee: Secondary | ICD-10-CM | POA: Diagnosis not present

## 2018-11-17 DIAGNOSIS — I10 Essential (primary) hypertension: Secondary | ICD-10-CM | POA: Diagnosis not present

## 2018-11-17 DIAGNOSIS — M25561 Pain in right knee: Secondary | ICD-10-CM | POA: Diagnosis not present

## 2018-12-12 ENCOUNTER — Telehealth: Payer: Self-pay

## 2018-12-12 NOTE — Telephone Encounter (Signed)
12/12/2018 Spoke with patient she is not in need of a new cane.  Her son put zebra tape on her cane to help her keep track of it.  She does not have any other needs at present.MA

## 2018-12-20 DIAGNOSIS — M5126 Other intervertebral disc displacement, lumbar region: Secondary | ICD-10-CM | POA: Diagnosis not present

## 2018-12-20 DIAGNOSIS — M47817 Spondylosis without myelopathy or radiculopathy, lumbosacral region: Secondary | ICD-10-CM | POA: Diagnosis not present

## 2018-12-20 DIAGNOSIS — G894 Chronic pain syndrome: Secondary | ICD-10-CM | POA: Diagnosis not present

## 2018-12-20 DIAGNOSIS — F418 Other specified anxiety disorders: Secondary | ICD-10-CM | POA: Diagnosis not present

## 2018-12-20 DIAGNOSIS — M19019 Primary osteoarthritis, unspecified shoulder: Secondary | ICD-10-CM | POA: Diagnosis not present

## 2018-12-20 DIAGNOSIS — Z79899 Other long term (current) drug therapy: Secondary | ICD-10-CM | POA: Diagnosis not present

## 2018-12-25 DIAGNOSIS — M47817 Spondylosis without myelopathy or radiculopathy, lumbosacral region: Secondary | ICD-10-CM | POA: Diagnosis not present

## 2019-01-16 ENCOUNTER — Encounter: Payer: Medicare Other | Admitting: Internal Medicine

## 2019-01-16 NOTE — Progress Notes (Deleted)
    Date:  01/16/2019   Name:  Andera Cranmer   DOB:  11/03/1945   MRN:  [id number]   Chief Complaint: No chief complaint on file. Vinessa Macconnell is a 73 y.o. female who presents today for her annual exam. She feels {DESC; WELL/FAIRLY WELL/POORLY:18703}. She reports exercising ***. She reports she is sleeping {DESC; WELL/FAIRLY WELL/POORLY:18703}.   Mammogram 08/2018 Colonoscopy  2010 Pneumonia vaccines complete  HPI  Review of Systems  Patient Active Problem List   Diagnosis Date Noted  . Pseudogout involving multiple joints 11/16/2017  . Joint inflammation of left hand and wrist 11/07/2017  . Weakness of both lower extremities 11/03/2017  . Colitis 08/31/2017  . Essential hypertension 08/10/2017  . Systolic dysfunction 02/40/9735  . Asthma, persistent controlled 07/10/2017  . Left ventricular enlargement 06/22/2017  . Disorder of bursae of shoulder region 01/30/2017  . Displacement of lumbar intervertebral disc without myelopathy 01/30/2017  . Venous insufficiency of both lower extremities 06/14/2016  . Bladder cystocele 05/19/2015  . Degenerative arthritis of hip 05/19/2015    No Known Allergies  Past Surgical History:  Procedure Laterality Date  . TOTAL ABDOMINAL HYSTERECTOMY  1998  . TOTAL HIP ARTHROPLASTY Right 2014    Social History   Tobacco Use  . Smoking status: Never Smoker  . Smokeless tobacco: Never Used  . Tobacco comment: smoking cessation materials not required  Substance Use Topics  . Alcohol use: Not Currently  . Drug use: No     Medication list has been reviewed and updated.  No outpatient medications have been marked as taking for the 01/16/19 encounter (Appointment) with Glean Hess, MD.   Current Facility-Administered Medications for the 01/16/19 encounter (Appointment) with Glean Hess, MD  Medication  . albuterol (PROVENTIL) (2.5 MG/3ML) 0.083% nebulizer solution 2.5 mg    PHQ 2/9 Scores 08/21/2018 11/09/2017 11/07/2017  11/01/2016  PHQ - 2 Score 0 6 0 0  PHQ- 9 Score - 8 0 -    BP Readings from Last 3 Encounters:  08/21/18 120/68  07/23/18 138/80  03/13/18 132/72    Physical Exam  Wt Readings from Last 3 Encounters:  08/21/18 184 lb (83.5 kg)  07/23/18 184 lb (83.5 kg)  03/13/18 184 lb (83.5 kg)    There were no vitals taken for this visit.  Assessment and Plan:

## 2019-01-24 DIAGNOSIS — F418 Other specified anxiety disorders: Secondary | ICD-10-CM | POA: Diagnosis not present

## 2019-01-24 DIAGNOSIS — M19019 Primary osteoarthritis, unspecified shoulder: Secondary | ICD-10-CM | POA: Diagnosis not present

## 2019-01-24 DIAGNOSIS — Z5181 Encounter for therapeutic drug level monitoring: Secondary | ICD-10-CM | POA: Diagnosis not present

## 2019-01-24 DIAGNOSIS — M5126 Other intervertebral disc displacement, lumbar region: Secondary | ICD-10-CM | POA: Diagnosis not present

## 2019-01-24 DIAGNOSIS — G894 Chronic pain syndrome: Secondary | ICD-10-CM | POA: Diagnosis not present

## 2019-01-24 DIAGNOSIS — Z79899 Other long term (current) drug therapy: Secondary | ICD-10-CM | POA: Diagnosis not present

## 2019-01-24 DIAGNOSIS — M5416 Radiculopathy, lumbar region: Secondary | ICD-10-CM | POA: Diagnosis not present

## 2019-01-24 DIAGNOSIS — M47817 Spondylosis without myelopathy or radiculopathy, lumbosacral region: Secondary | ICD-10-CM | POA: Diagnosis not present

## 2019-02-07 ENCOUNTER — Encounter: Payer: Self-pay | Admitting: Internal Medicine

## 2019-02-25 DIAGNOSIS — Z79899 Other long term (current) drug therapy: Secondary | ICD-10-CM | POA: Diagnosis not present

## 2019-02-25 DIAGNOSIS — M069 Rheumatoid arthritis, unspecified: Secondary | ICD-10-CM | POA: Diagnosis not present

## 2019-03-04 DIAGNOSIS — R1032 Left lower quadrant pain: Secondary | ICD-10-CM | POA: Diagnosis not present

## 2019-03-04 DIAGNOSIS — I517 Cardiomegaly: Secondary | ICD-10-CM | POA: Diagnosis not present

## 2019-03-04 DIAGNOSIS — K5730 Diverticulosis of large intestine without perforation or abscess without bleeding: Secondary | ICD-10-CM | POA: Diagnosis not present

## 2019-03-04 DIAGNOSIS — N200 Calculus of kidney: Secondary | ICD-10-CM | POA: Diagnosis not present

## 2019-03-05 DIAGNOSIS — N200 Calculus of kidney: Secondary | ICD-10-CM | POA: Diagnosis not present

## 2019-03-05 DIAGNOSIS — R1032 Left lower quadrant pain: Secondary | ICD-10-CM | POA: Diagnosis not present

## 2019-03-06 DIAGNOSIS — M069 Rheumatoid arthritis, unspecified: Secondary | ICD-10-CM | POA: Diagnosis not present

## 2019-03-06 DIAGNOSIS — M159 Polyosteoarthritis, unspecified: Secondary | ICD-10-CM | POA: Diagnosis not present

## 2019-03-13 ENCOUNTER — Telehealth: Payer: Self-pay

## 2019-03-13 ENCOUNTER — Other Ambulatory Visit: Payer: Self-pay

## 2019-03-13 NOTE — Telephone Encounter (Signed)
Patient called as a FYI informing us she has been seeing Dr. Mordecai Maes for her R.A. She has a new medication she takes 4tabs weekly. Wanted to call and let us know and see if she needed follow up with [place] for this.  Tried calling pt back but her mailbox was full. Wanted to let her know we do not need to see her for this. To continue to follow up with Dr. Mordecai Maes and I added methotrexate 4tabs weekly to her med list. Will await her callback to let her know this information.

## 2019-03-18 ENCOUNTER — Ambulatory Visit: Payer: Medicare Other

## 2019-04-16 IMAGING — CR DG CHEST 2V
2 series · 2 of 2 positions shown · non-contrast
Comparison: None.

CLINICAL DATA: Asthma short of breath

EXAM:
CHEST  2 VIEW

[chest pa]
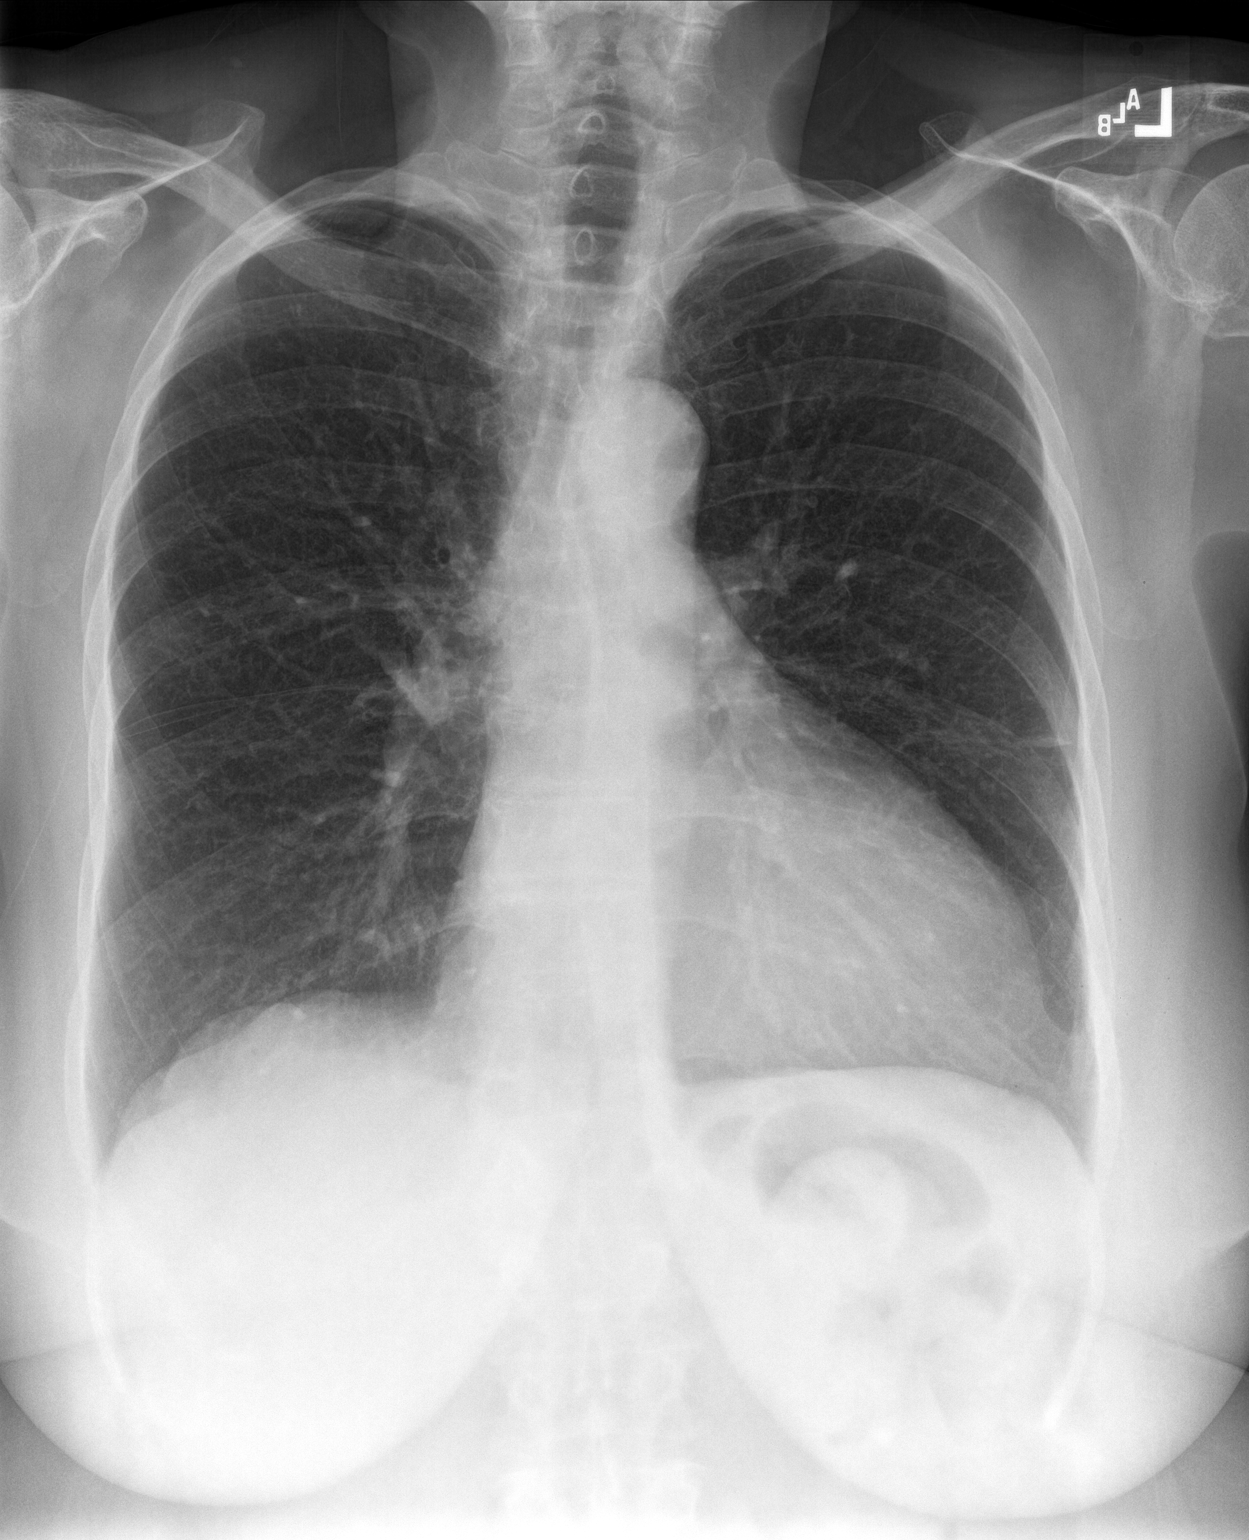

[chest lat]
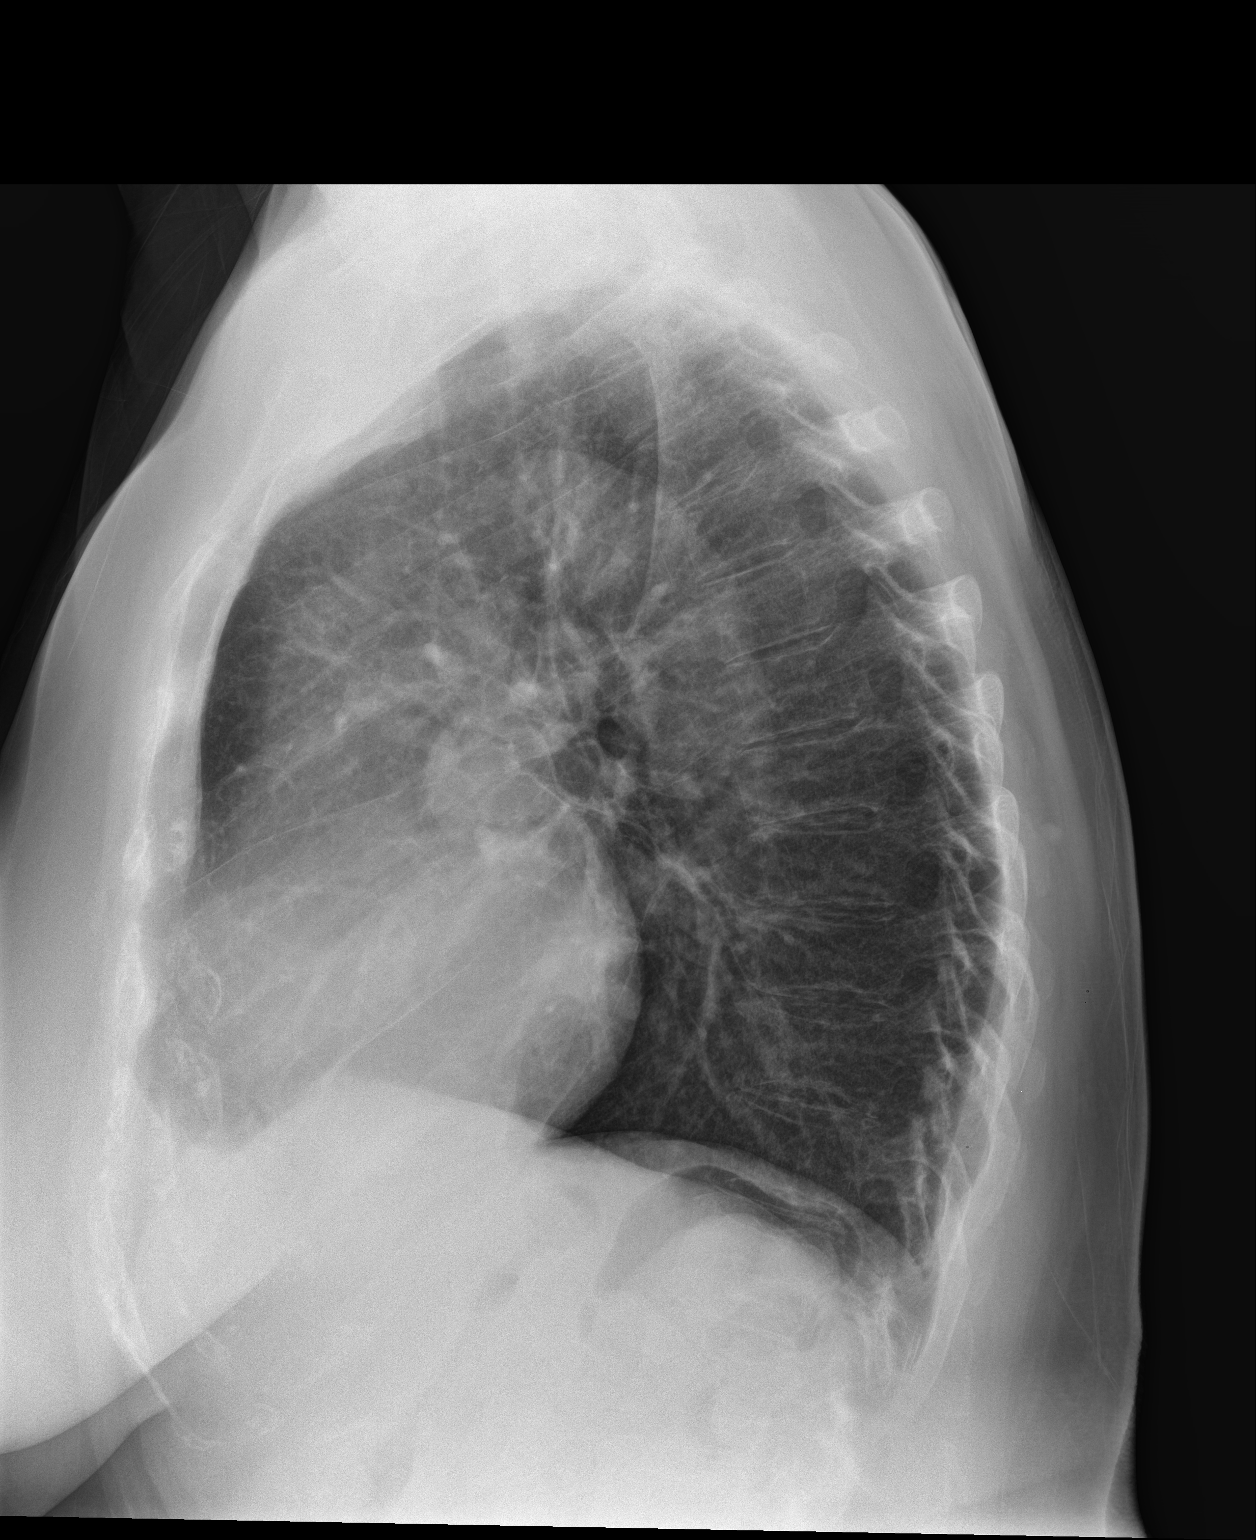

[2 of 2 positions shown; findings below may reference images not displayed]

FINDINGS: Cardiac enlargement with left ventricular dilatation. Negative for
heart failure. Lungs are clear without infiltrate effusion or mass.
No acute skeletal abnormality.
IMPRESSION: Cardiac enlargement.  No acute cardiopulmonary abnormality.

## 2019-04-23 DIAGNOSIS — M5126 Other intervertebral disc displacement, lumbar region: Secondary | ICD-10-CM | POA: Diagnosis not present

## 2019-04-23 DIAGNOSIS — G894 Chronic pain syndrome: Secondary | ICD-10-CM | POA: Diagnosis not present

## 2019-04-23 DIAGNOSIS — Z79899 Other long term (current) drug therapy: Secondary | ICD-10-CM | POA: Diagnosis not present

## 2019-04-23 DIAGNOSIS — M5416 Radiculopathy, lumbar region: Secondary | ICD-10-CM | POA: Diagnosis not present

## 2019-04-23 DIAGNOSIS — M47817 Spondylosis without myelopathy or radiculopathy, lumbosacral region: Secondary | ICD-10-CM | POA: Diagnosis not present

## 2019-04-23 DIAGNOSIS — M19019 Primary osteoarthritis, unspecified shoulder: Secondary | ICD-10-CM | POA: Diagnosis not present

## 2019-04-23 DIAGNOSIS — F418 Other specified anxiety disorders: Secondary | ICD-10-CM | POA: Diagnosis not present

## 2019-06-10 DIAGNOSIS — M069 Rheumatoid arthritis, unspecified: Secondary | ICD-10-CM | POA: Diagnosis not present

## 2019-07-07 DIAGNOSIS — R918 Other nonspecific abnormal finding of lung field: Secondary | ICD-10-CM | POA: Diagnosis not present

## 2019-07-07 DIAGNOSIS — S20212A Contusion of left front wall of thorax, initial encounter: Secondary | ICD-10-CM | POA: Diagnosis not present

## 2019-07-07 DIAGNOSIS — S2242XA Multiple fractures of ribs, left side, initial encounter for closed fracture: Secondary | ICD-10-CM | POA: Diagnosis not present

## 2019-07-07 DIAGNOSIS — Y33XXXA Other specified events, undetermined intent, initial encounter: Secondary | ICD-10-CM | POA: Diagnosis not present

## 2019-07-30 DIAGNOSIS — Z20828 Contact with and (suspected) exposure to other viral communicable diseases: Secondary | ICD-10-CM | POA: Diagnosis not present

## 2019-07-30 DIAGNOSIS — Z20822 Contact with and (suspected) exposure to covid-19: Secondary | ICD-10-CM | POA: Diagnosis not present

## 2019-08-01 DIAGNOSIS — Z5181 Encounter for therapeutic drug level monitoring: Secondary | ICD-10-CM | POA: Diagnosis not present

## 2019-08-01 DIAGNOSIS — M5126 Other intervertebral disc displacement, lumbar region: Secondary | ICD-10-CM | POA: Diagnosis not present

## 2019-08-01 DIAGNOSIS — Z79891 Long term (current) use of opiate analgesic: Secondary | ICD-10-CM | POA: Diagnosis not present

## 2019-08-01 DIAGNOSIS — M19019 Primary osteoarthritis, unspecified shoulder: Secondary | ICD-10-CM | POA: Diagnosis not present

## 2019-08-01 DIAGNOSIS — F418 Other specified anxiety disorders: Secondary | ICD-10-CM | POA: Diagnosis not present

## 2019-08-01 DIAGNOSIS — M5416 Radiculopathy, lumbar region: Secondary | ICD-10-CM | POA: Diagnosis not present

## 2019-08-01 DIAGNOSIS — Z79899 Other long term (current) drug therapy: Secondary | ICD-10-CM | POA: Diagnosis not present

## 2019-08-01 DIAGNOSIS — M47817 Spondylosis without myelopathy or radiculopathy, lumbosacral region: Secondary | ICD-10-CM | POA: Diagnosis not present

## 2019-08-01 DIAGNOSIS — G894 Chronic pain syndrome: Secondary | ICD-10-CM | POA: Diagnosis not present

## 2019-09-02 DIAGNOSIS — M19011 Primary osteoarthritis, right shoulder: Secondary | ICD-10-CM | POA: Diagnosis not present

## 2019-09-02 DIAGNOSIS — M5126 Other intervertebral disc displacement, lumbar region: Secondary | ICD-10-CM | POA: Diagnosis not present

## 2019-09-02 DIAGNOSIS — M19012 Primary osteoarthritis, left shoulder: Secondary | ICD-10-CM | POA: Diagnosis not present

## 2019-09-02 DIAGNOSIS — M25511 Pain in right shoulder: Secondary | ICD-10-CM | POA: Diagnosis not present

## 2019-09-02 DIAGNOSIS — M25519 Pain in unspecified shoulder: Secondary | ICD-10-CM | POA: Diagnosis not present

## 2019-09-02 DIAGNOSIS — Z79891 Long term (current) use of opiate analgesic: Secondary | ICD-10-CM | POA: Diagnosis not present

## 2019-09-02 DIAGNOSIS — M5416 Radiculopathy, lumbar region: Secondary | ICD-10-CM | POA: Diagnosis not present

## 2019-09-02 DIAGNOSIS — M25512 Pain in left shoulder: Secondary | ICD-10-CM | POA: Diagnosis not present

## 2019-09-02 DIAGNOSIS — G894 Chronic pain syndrome: Secondary | ICD-10-CM | POA: Diagnosis not present

## 2019-09-02 DIAGNOSIS — Z79899 Other long term (current) drug therapy: Secondary | ICD-10-CM | POA: Diagnosis not present

## 2019-09-02 DIAGNOSIS — M19019 Primary osteoarthritis, unspecified shoulder: Secondary | ICD-10-CM | POA: Diagnosis not present

## 2019-09-02 DIAGNOSIS — F418 Other specified anxiety disorders: Secondary | ICD-10-CM | POA: Diagnosis not present

## 2019-09-02 DIAGNOSIS — M47817 Spondylosis without myelopathy or radiculopathy, lumbosacral region: Secondary | ICD-10-CM | POA: Diagnosis not present

## 2019-09-10 DIAGNOSIS — M25511 Pain in right shoulder: Secondary | ICD-10-CM | POA: Diagnosis not present

## 2019-09-10 DIAGNOSIS — M25561 Pain in right knee: Secondary | ICD-10-CM | POA: Diagnosis not present

## 2019-09-11 DIAGNOSIS — M069 Rheumatoid arthritis, unspecified: Secondary | ICD-10-CM | POA: Diagnosis not present

## 2019-09-11 DIAGNOSIS — M159 Polyosteoarthritis, unspecified: Secondary | ICD-10-CM | POA: Diagnosis not present

## 2019-09-16 DIAGNOSIS — I5020 Unspecified systolic (congestive) heart failure: Secondary | ICD-10-CM | POA: Insufficient documentation

## 2019-09-17 DIAGNOSIS — I10 Essential (primary) hypertension: Secondary | ICD-10-CM | POA: Diagnosis not present

## 2019-09-17 DIAGNOSIS — I5020 Unspecified systolic (congestive) heart failure: Secondary | ICD-10-CM | POA: Diagnosis not present

## 2019-09-25 DIAGNOSIS — I872 Venous insufficiency (chronic) (peripheral): Secondary | ICD-10-CM | POA: Diagnosis not present

## 2019-09-25 DIAGNOSIS — Z7952 Long term (current) use of systemic steroids: Secondary | ICD-10-CM | POA: Diagnosis not present

## 2019-09-25 DIAGNOSIS — J45909 Unspecified asthma, uncomplicated: Secondary | ICD-10-CM | POA: Diagnosis not present

## 2019-09-25 DIAGNOSIS — I110 Hypertensive heart disease with heart failure: Secondary | ICD-10-CM | POA: Diagnosis not present

## 2019-09-25 DIAGNOSIS — I5020 Unspecified systolic (congestive) heart failure: Secondary | ICD-10-CM | POA: Diagnosis not present

## 2019-09-25 DIAGNOSIS — I361 Nonrheumatic tricuspid (valve) insufficiency: Secondary | ICD-10-CM | POA: Diagnosis not present

## 2019-09-25 DIAGNOSIS — Z79899 Other long term (current) drug therapy: Secondary | ICD-10-CM | POA: Diagnosis not present

## 2019-09-25 DIAGNOSIS — I10 Essential (primary) hypertension: Secondary | ICD-10-CM | POA: Diagnosis not present

## 2019-09-25 DIAGNOSIS — M2550 Pain in unspecified joint: Secondary | ICD-10-CM | POA: Diagnosis not present

## 2019-10-04 ENCOUNTER — Other Ambulatory Visit: Payer: Self-pay

## 2019-10-04 ENCOUNTER — Ambulatory Visit (INDEPENDENT_AMBULATORY_CARE_PROVIDER_SITE_OTHER): Payer: Medicare Other | Admitting: Internal Medicine

## 2019-10-04 ENCOUNTER — Encounter: Payer: Self-pay | Admitting: Internal Medicine

## 2019-10-04 DIAGNOSIS — B370 Candidal stomatitis: Secondary | ICD-10-CM

## 2019-10-04 DIAGNOSIS — R05 Cough: Secondary | ICD-10-CM

## 2019-10-04 DIAGNOSIS — J029 Acute pharyngitis, unspecified: Secondary | ICD-10-CM | POA: Diagnosis not present

## 2019-10-04 DIAGNOSIS — R059 Cough, unspecified: Secondary | ICD-10-CM

## 2019-10-04 MED ORDER — NYSTATIN 100000 UNIT/ML MT SUSP: 5.0000 mL | Freq: Four times a day (QID) | OROMUCOSAL | 0 refills | Status: DC

## 2019-10-04 MED ORDER — AMOXICILLIN 875 MG PO TABS: 875.0000 mg | ORAL_TABLET | Freq: Two times a day (BID) | ORAL | 0 refills | Status: AC

## 2019-10-04 NOTE — Progress Notes (Signed)
Date:  10/04/2019   Name:  Hayley Ewing   DOB:  1946-02-10   MRN:  [id number]  I connected with this patient, Hayley Ewing, by telephone at the patient's home.  I verified that I am speaking with the correct person using two identifiers. This visit was conducted via telephone due to the Covid-19 outbreak from my office at [facility] in [place], [place]. I discussed the limitations, risks, security and privacy concerns of performing an evaluation and management service by telephone. I also discussed with the patient that there may be a patient responsible charge related to this service. The patient expressed understanding and agreed to proceed.  Chief Complaint: Cough (cough is worse with laying down.  wheezing.  she feels that she has thrush.  )  Sinus Problem This is a new problem. The current episode started 1 to 4 weeks ago. There has been no fever. The pain is moderate. Associated symptoms include coughing, shortness of breath (chronic), sinus pressure and a sore throat. Pertinent negatives include no chills, diaphoresis, ear pain or headaches. Past treatments include nothing.  Dental Pain  Associated symptoms include sinus pressure. Pertinent negatives include no fever.  Sore Throat  This is a new problem. The current episode started 1 to 4 weeks ago. The problem has been gradually worsening. There has been no fever. Associated symptoms include coughing, shortness of breath (chronic) and trouble swallowing. Pertinent negatives include no abdominal pain, diarrhea, ear pain or headaches.    Lab Results  Component Value Date   CREATININE 0.76 11/01/2016   BUN 14 11/01/2016   NA 142 11/01/2016   K 4.7 11/01/2016   CL 103 11/01/2016   CO2 25 11/01/2016   Lab Results  Component Value Date   CHOL 234 (H) 11/01/2016   HDL 97 11/01/2016   LDLCALC 116 (H) 11/01/2016   TRIG 105 11/01/2016   CHOLHDL 2.4 11/01/2016   Lab Results  Component Value Date   TSH 0.757 11/01/2016    No results found for: HGBA1C   Review of Systems  Constitutional: Negative for chills, diaphoresis and fever.  HENT: Positive for mouth sores, postnasal drip, sinus pressure, sore throat and trouble swallowing. Negative for ear pain.   Respiratory: Positive for cough, shortness of breath (chronic) and wheezing.   Cardiovascular: Negative for chest pain, palpitations and leg swelling.  Gastrointestinal: Negative for abdominal pain, constipation and diarrhea.  Neurological: Negative for dizziness, light-headedness and headaches.    Patient Active Problem List   Diagnosis Date Noted  . Pseudogout involving multiple joints 11/16/2017  . Joint inflammation of left hand and wrist 11/07/2017  . Weakness of both lower extremities 11/03/2017  . Colitis 08/31/2017  . Essential hypertension 08/10/2017  . Systolic dysfunction 08/10/2017  . Asthma, persistent controlled 07/10/2017  . Left ventricular enlargement 06/22/2017  . Disorder of bursae of shoulder region 01/30/2017  . Displacement of lumbar intervertebral disc without myelopathy 01/30/2017  . Venous insufficiency of both lower extremities 06/14/2016  . Bladder cystocele 05/19/2015  . S/P hip replacement, right 05/19/2015    No Known Allergies  Past Surgical History:  Procedure Laterality Date  . TOTAL ABDOMINAL HYSTERECTOMY  1998  . TOTAL HIP ARTHROPLASTY Right 2014    Social History   Tobacco Use  . Smoking status: Never Smoker  . Smokeless tobacco: Never Used  . Tobacco comment: smoking cessation materials not required  Substance Use Topics  . Alcohol use: Not Currently  . Drug use: No  Medication list has been reviewed and updated.  Current Meds  Medication Sig  . carvedilol (COREG) 12.5 MG tablet Take 12.5 mg by mouth 2 (two) times daily with a meal.   . DULoxetine (CYMBALTA) 60 MG capsule Take 60 mg by mouth daily.  . folic acid (FOLVITE) 1 MG tablet   . furosemide (LASIX) 20 MG tablet Take 1 tablet (20  mg total) by mouth daily as needed.  Marland Kitchen HYDROcodone-acetaminophen (NORCO) 10-325 MG tablet Take 1 tablet by mouth 2 (two) times daily.   Marland Kitchen ibuprofen (ADVIL,MOTRIN) 600 MG tablet Take 600 mg by mouth 2 (two) times daily.  Marland Kitchen lidocaine (LIDODERM) 5 % Place 1 patch onto the skin daily. Remove & Discard patch within 12 hours or as directed by MD  . lisinopril (ZESTRIL) 40 MG tablet Take 40 mg by mouth daily.  . methotrexate (RHEUMATREX) 2.5 MG tablet Take 4 tablets by mouth once a week. Rheumatoid Arthritis- Dr Mordecai Maes.  . predniSONE (DELTASONE) 10 MG tablet prednisone 10 mg tablet  TAKE ONE TO FOUR TABLETS BY MOUTH AS NEEDED FOR ARTHRITIS FLARE  . spironolactone (ALDACTONE) 25 MG tablet Take 25 mg by mouth daily.    Current Facility-Administered Medications for the 10/04/19 encounter (Office Visit) with Glean Hess, MD  Medication  . albuterol (PROVENTIL) (2.5 MG/3ML) 0.083% nebulizer solution 2.5 mg    PHQ 2/9 Scores 10/04/2019 08/21/2018 11/09/2017 11/07/2017  PHQ - 2 Score 0 0 6 0  PHQ- 9 Score - - 8 0    BP Readings from Last 3 Encounters:  08/21/18 120/68  07/23/18 138/80  03/13/18 132/72    Physical Exam Pulmonary:     Comments: dry cough but no dyspnea noted during the call Neurological:     Mental Status: She is alert.  Psychiatric:        Attention and Perception: Attention normal.        Mood and Affect: Mood normal.        Speech: Speech normal.     Wt Readings from Last 3 Encounters:  08/21/18 184 lb (83.5 kg)  07/23/18 184 lb (83.5 kg)  03/13/18 184 lb (83.5 kg)   No vitals are available due to virtual visit. There were no vitals taken for this visit.  Assessment and Plan: 1. Thrush, oral - nystatin (MYCOSTATIN) 100000 UNIT/ML suspension; Take 5 mLs (500,000 Units total) by mouth 4 (four) times daily. Swish, gargle and spit  Dispense: 150 mL; Refill: 0  2. Pharyngitis, unspecified etiology With throat irritation and cough - amoxicillin (AMOXIL) 875 MG  tablet; Take 1 tablet (875 mg total) by mouth 2 (two) times daily for 10 days.  Dispense: 20 tablet; Refill: 0  3. Cough Resume daily use of symbicort MDI   Partially dictated using Editor, commissioning. Any errors are unintentional.  Halina Maidens, MD [facility]  10/04/2019

## 2019-10-16 DIAGNOSIS — M47817 Spondylosis without myelopathy or radiculopathy, lumbosacral region: Secondary | ICD-10-CM | POA: Diagnosis not present

## 2019-10-17 DIAGNOSIS — I5020 Unspecified systolic (congestive) heart failure: Secondary | ICD-10-CM | POA: Diagnosis not present

## 2019-10-17 DIAGNOSIS — I10 Essential (primary) hypertension: Secondary | ICD-10-CM | POA: Diagnosis not present

## 2019-10-22 ENCOUNTER — Ambulatory Visit (INDEPENDENT_AMBULATORY_CARE_PROVIDER_SITE_OTHER): Payer: Medicare Other | Admitting: Internal Medicine

## 2019-10-22 ENCOUNTER — Other Ambulatory Visit: Payer: Self-pay

## 2019-10-22 ENCOUNTER — Other Ambulatory Visit: Payer: Self-pay | Admitting: Internal Medicine

## 2019-10-22 ENCOUNTER — Encounter: Payer: Self-pay | Admitting: Internal Medicine

## 2019-10-22 VITALS — BP 130/90 | HR 98 | Temp 98.0°F | Ht 64.0 in | Wt 184.0 lb

## 2019-10-22 DIAGNOSIS — Z23 Encounter for immunization: Secondary | ICD-10-CM

## 2019-10-22 DIAGNOSIS — R8271 Bacteriuria: Secondary | ICD-10-CM

## 2019-10-22 DIAGNOSIS — Z1211 Encounter for screening for malignant neoplasm of colon: Secondary | ICD-10-CM

## 2019-10-22 DIAGNOSIS — J45998 Other asthma: Secondary | ICD-10-CM | POA: Diagnosis not present

## 2019-10-22 DIAGNOSIS — S81801A Unspecified open wound, right lower leg, initial encounter: Secondary | ICD-10-CM

## 2019-10-22 DIAGNOSIS — Z1231 Encounter for screening mammogram for malignant neoplasm of breast: Secondary | ICD-10-CM

## 2019-10-22 DIAGNOSIS — I10 Essential (primary) hypertension: Secondary | ICD-10-CM | POA: Diagnosis not present

## 2019-10-22 DIAGNOSIS — Z0000 Encounter for general adult medical examination without abnormal findings: Secondary | ICD-10-CM | POA: Diagnosis not present

## 2019-10-22 DIAGNOSIS — M159 Polyosteoarthritis, unspecified: Secondary | ICD-10-CM | POA: Insufficient documentation

## 2019-10-22 DIAGNOSIS — I5020 Unspecified systolic (congestive) heart failure: Secondary | ICD-10-CM

## 2019-10-22 DIAGNOSIS — M0579 Rheumatoid arthritis with rheumatoid factor of multiple sites without organ or systems involvement: Secondary | ICD-10-CM

## 2019-10-22 DIAGNOSIS — R739 Hyperglycemia, unspecified: Secondary | ICD-10-CM

## 2019-10-22 LAB — POCT URINALYSIS DIPSTICK
Bilirubin, UA: NEGATIVE
Glucose, UA: NEGATIVE
Ketones, UA: NEGATIVE
Nitrite, UA: POSITIVE
Protein, UA: NEGATIVE
Spec Grav, UA: 1.015 (ref 1.010–1.025)
Urobilinogen, UA: 0.2 E.U./dL
pH, UA: 6 (ref 5.0–8.0)

## 2019-10-22 NOTE — Progress Notes (Signed)
Date:  10/22/2019   Name:  Hayley Ewing   DOB:  1945/11/13   MRN:  [id number]   Chief Complaint: Annual Exam (Breast Exam. No pap- aged out.) Hayley Ewing is a 74 y.o. female who presents today for her Complete Annual Exam. She feels fairly well. She reports exercising rarely. She reports she is sleeping poorly.   Mammogram 08/2018 - scheduled DEXA 2002 Colonoscopy  08/2008 at [place] History  Administered Date(s) Administered  . Pneumococcal Conjugate-13 11/01/2016  . Pneumococcal Polysaccharide-23 11/09/2017  . Tdap 04/30/2008    Hypertension This is a chronic problem. The problem is controlled. Pertinent negatives include no chest pain, headaches, palpitations or shortness of breath. Past treatments include ACE inhibitors, diuretics and beta blockers. Hypertensive end-organ damage includes heart failure.  Asthma There is no cough, shortness of breath or wheezing. This is a recurrent problem. The problem has been unchanged. Pertinent negatives include no chest pain, fever, headaches or trouble swallowing. Her symptoms are alleviated by beta-agonist and steroid inhaler. She reports significant improvement on treatment. Her past medical history is significant for asthma.   HF with rEF - 08/2019 ECHO EF 55% showed improvement.  She recently had increase in BP so meds were adjusted.  RA with DJD multiple joints - followed by rheumatology and Orthopedics with pain management.  She is on MTX and Hydrocodone.  She takes prednisone intermittently for flares. Her pain is better controlled now and she has not had to return to the hospital.  Leg wound - Right lower leg; cut on the dishwasher door on 10/03/19.  It is healing slowly.  Using TAO.  No bleeding or odor.  Lab Results  Component Value Date   CREATININE 0.76 11/01/2016   BUN 14 11/01/2016   NA 142 11/01/2016   K 4.7 11/01/2016   CL 103 11/01/2016   CO2 25 11/01/2016   Lab Results  Component Value Date   CHOL 234  (H) 11/01/2016   HDL 97 11/01/2016   LDLCALC 116 (H) 11/01/2016   TRIG 105 11/01/2016   CHOLHDL 2.4 11/01/2016   Lab Results  Component Value Date   TSH 0.757 11/01/2016   No results found for: HGBA1C Lab Results  Component Value Date   WBC 4.7 11/01/2016   HGB 10.8 (L) 11/01/2016   HCT 34.5 11/01/2016   MCV 74 (L) 11/01/2016   PLT 274 11/01/2016   Lab Results  Component Value Date   ALT 19 11/01/2016   AST 19 11/01/2016   ALKPHOS 76 11/01/2016   BILITOT 0.3 11/01/2016     Review of Systems  Constitutional: Negative for chills, fatigue, fever and unexpected weight change.  HENT: Negative for congestion, hearing loss, tinnitus, trouble swallowing and voice change.   Eyes: Negative for visual disturbance.  Respiratory: Negative for cough, chest tightness, shortness of breath and wheezing.   Cardiovascular: Negative for chest pain, palpitations and leg swelling.  Gastrointestinal: Negative for abdominal pain, constipation, diarrhea and vomiting.  Endocrine: Negative for polydipsia and polyuria.  Genitourinary: Negative for dysuria, frequency, genital sores, vaginal bleeding and vaginal discharge.  Musculoskeletal: Positive for arthralgias. Negative for gait problem and joint swelling.  Skin: Positive for wound. Negative for color change and rash.  Neurological: Negative for dizziness, tremors, light-headedness and headaches.  Hematological: Negative for adenopathy. Does not bruise/bleed easily.  Psychiatric/Behavioral: Positive for sleep disturbance. Negative for dysphoric mood. The patient is not nervous/anxious.     Patient Active Problem List   Diagnosis Date Noted  .  Generalized osteoarthritis 10/22/2019  . Heart failure with reduced ejection fraction ([place]) 09/16/2019  . Chondrocalcinosis 01/15/2018  . Rheumatoid arthritis ([place]) 01/15/2018  . Colitis 08/31/2017  . Essential hypertension 08/10/2017  . Asthma, persistent controlled 07/10/2017  . Displacement of  lumbar intervertebral disc without myelopathy 01/30/2017  . Venous insufficiency of both lower extremities 06/14/2016  . Bladder cystocele 05/19/2015  . S/P hip replacement, right 05/19/2015    No Known Allergies  Past Surgical History:  Procedure Laterality Date  . TOTAL ABDOMINAL HYSTERECTOMY  1998  . TOTAL HIP ARTHROPLASTY Right 2014    Social History   Tobacco Use  . Smoking status: Never Smoker  . Smokeless tobacco: Never Used  . Tobacco comment: smoking cessation materials not required  Substance Use Topics  . Alcohol use: Not Currently  . Drug use: No     Medication list has been reviewed and updated.  Current Meds  Medication Sig  . carvedilol (COREG) 12.5 MG tablet Take 12.5 mg by mouth 2 (two) times daily with a meal.   . DULoxetine (CYMBALTA) 60 MG capsule Take 60 mg by mouth daily.  . folic acid (FOLVITE) 1 MG tablet   . HYDROcodone-acetaminophen (NORCO) 10-325 MG tablet Take 1 tablet by mouth 2 (two) times daily.   Marland Kitchen ibuprofen (ADVIL,MOTRIN) 600 MG tablet Take 600 mg by mouth 2 (two) times daily.  Marland Kitchen lidocaine (LIDODERM) 5 % Place 1 patch onto the skin daily. Remove & Discard patch within 12 hours or as directed by MD  . lisinopril (ZESTRIL) 40 MG tablet Take 40 mg by mouth daily.  . methotrexate (RHEUMATREX) 2.5 MG tablet Take 4 tablets by mouth once a week. Rheumatoid Arthritis- Dr Mordecai Maes.  Marland Kitchen NARCAN 4 MG/0.1ML LIQD nasal spray kit SMARTSIG:1 Spray(s) Both Nares Once PRN  . nystatin (MYCOSTATIN) 100000 UNIT/ML suspension Take 5 mLs (500,000 Units total) by mouth 4 (four) times daily. Swish, gargle and spit  . predniSONE (DELTASONE) 10 MG tablet prednisone 10 mg tablet  TAKE ONE TO FOUR TABLETS BY MOUTH AS NEEDED FOR ARTHRITIS FLARE   Current Facility-Administered Medications for the 10/22/19 encounter (Office Visit) with Glean Hess, MD  Medication  . albuterol (PROVENTIL) (2.5 MG/3ML) 0.083% nebulizer solution 2.5 mg    PHQ 2/9 Scores 10/22/2019  10/04/2019 08/21/2018 11/09/2017  PHQ - 2 Score 0 0 0 6  PHQ- 9 Score 6 - - 8    BP Readings from Last 3 Encounters:  10/22/19 130/90  08/21/18 120/68  07/23/18 138/80    Physical Exam Vitals and nursing note reviewed.  Constitutional:      General: She is not in acute distress.    Appearance: Normal appearance. She is well-developed.  HENT:     Head: Normocephalic and atraumatic.     Right Ear: Tympanic membrane and ear canal normal.     Left Ear: Tympanic membrane and ear canal normal.     Nose:     Right Sinus: No maxillary sinus tenderness.     Left Sinus: No maxillary sinus tenderness.  Eyes:     General: No scleral icterus.       Right eye: No discharge.        Left eye: No discharge.     Conjunctiva/sclera: Conjunctivae normal.  Neck:     Thyroid: No thyromegaly.     Vascular: No carotid bruit.  Cardiovascular:     Rate and Rhythm: Normal rate and regular rhythm.     Pulses: Normal pulses.  Heart sounds: Normal heart sounds.  Pulmonary:     Effort: Pulmonary effort is normal. No respiratory distress.     Breath sounds: No wheezing.  Chest:     Breasts:        Right: No mass, nipple discharge, skin change or tenderness.        Left: No mass, nipple discharge, skin change or tenderness.  Abdominal:     General: Bowel sounds are normal.     Palpations: Abdomen is soft.     Tenderness: There is no abdominal tenderness. There is no guarding.  Musculoskeletal:        General: Normal range of motion.     Cervical back: Normal range of motion. No erythema.     Right lower leg: No edema.     Left lower leg: No edema.  Lymphadenopathy:     Cervical: No cervical adenopathy.  Skin:    General: Skin is warm and dry.     Capillary Refill: Capillary refill takes less than 2 seconds.     Findings: Wound present. No rash.     Comments: Shallow 0.5 cm ulcer with clean edges and base on outer lower right leg  Neurological:     General: No focal deficit present.      Mental Status: She is alert and oriented to person, place, and time.     Cranial Nerves: No cranial nerve deficit.     Sensory: No sensory deficit.     Deep Tendon Reflexes: Reflexes are normal and symmetric.  Psychiatric:        Speech: Speech normal.        Behavior: Behavior normal.        Thought Content: Thought content normal.     Wt Readings from Last 3 Encounters:  10/22/19 184 lb (83.5 kg)  08/21/18 184 lb (83.5 kg)  07/23/18 184 lb (83.5 kg)    BP 130/90   Pulse 98   Temp 98 F (36.7 C) (Temporal)   Ht _0  (1.626 m)   Wt 184 lb (83.5 kg)   SpO2 98%   BMI 31.58 kg/m   Assessment and Plan: 1. Annual physical exam Normal exam with good BP control Continue healthy diet, exercise if able  2. Encounter for screening mammogram for breast cancer Scheduled at [facility] - MM 3D SCREEN BREAST BILATERAL; Future  3. Essential hypertension Clinically stable exam with well controlled BP.  Improved with medication changes by Cardiology. Tolerating medications without side effects at this time. Pt to continue current regimen and low sodium diet; - Comprehensive metabolic panel - TSH - POCT urinalysis dipstick - large WBC/bacteria so will send for culture  4. Asthma, persistent controlled Currently using Symbicort seasonally only  5. Heart failure with reduced ejection fraction ([place])  improved EF; continue current medication and cardiology follow up - Lipid panel  6. Rheumatoid arthritis involving multiple sites with positive rheumatoid factor (HCC) Joint symptoms are stable on MTX and PRN prednisone Still having back pain - recently had a facet injection  7. Colon cancer screening She will contact [facility] to schedule  8. Elevated serum glucose - Hemoglobin A1c  9. Leg wound, right, initial encounter Continue local care Tdap given today.   Partially dictated using Editor, commissioning. Any errors are unintentional.  Halina Maidens, MD [facility]  10/22/2019    Partially dictated using Dragon software. Any errors are unintentional.  Halina Maidens, MD [facility]  10/22/2019

## 2019-10-22 NOTE — Patient Instructions (Addendum)
Covid Vaccine phone numbers: [facility] Dept. [place] [phone] [email or web address] - has Covid vaccines   https://www.cdc.gov/vaccines/hcp/vis/vis-statements/tdap.pdf">  Tdap (Tetanus, Diphtheria, Pertussis) Vaccine: What You Need to Know 1. Why get vaccinated? Tdap vaccine can prevent tetanus, diphtheria, and pertussis. Diphtheria and pertussis spread from person to person. Tetanus enters the body through cuts or wounds.  TETANUS (T) causes painful stiffening of the muscles. Tetanus can lead to serious health problems, including being unable to open the mouth, having trouble swallowing and breathing, or death.  DIPHTHERIA (D) can lead to difficulty breathing, heart failure, paralysis, or death.  PERTUSSIS (aP), also known as "whooping cough," can cause uncontrollable, violent coughing which makes it hard to breathe, eat, or drink. Pertussis can be extremely serious in babies and young children, causing pneumonia, convulsions, brain damage, or death. In teens and adults, it can cause weight loss, loss of bladder control, passing out, and rib fractures from severe coughing. 2. Tdap vaccine Tdap is only for children 7 years and older, adolescents, and adults.  Adolescents should receive a single dose of Tdap, preferably at age 58 or 73 years. Pregnant women should get a dose of Tdap during every pregnancy, to protect the newborn from pertussis. Infants are most at risk for severe, life-threatening complications from pertussis. Adults who have never received Tdap should get a dose of Tdap. Also, adults should receive a booster dose every 10 years, or earlier in the case of a severe and dirty wound or burn. Booster doses can be either Tdap or Td (a different vaccine that protects against tetanus and diphtheria but not pertussis). Tdap may be given at the same time as other vaccines. 3. Talk with your health care provider Tell your vaccine provider if the person getting the  vaccine:  Has had an allergic reaction after a previous dose of any vaccine that protects against tetanus, diphtheria, or pertussis, or has any severe, life-threatening allergies.  Has had a coma, decreased level of consciousness, or prolonged seizures within 7 days after a previous dose of any pertussis vaccine (DTP, DTaP, or Tdap).  Has seizures or another nervous system problem.  Has ever had Guillain-Barr Syndrome (also called GBS).  Has had severe pain or swelling after a previous dose of any vaccine that protects against tetanus or diphtheria. In some cases, your health care provider may decide to postpone Tdap vaccination to a future visit.  People with minor illnesses, such as a cold, may be vaccinated. People who are moderately or severely ill should usually wait until they recover before getting Tdap vaccine.  Your health care provider can give you more information. 4. Risks of a vaccine reaction  Pain, redness, or swelling where the shot was given, mild fever, headache, feeling tired, and nausea, vomiting, diarrhea, or stomachache sometimes happen after Tdap vaccine. People sometimes faint after medical procedures, including vaccination. Tell your provider if you feel dizzy or have vision changes or ringing in the ears.  As with any medicine, there is a very remote chance of a vaccine causing a severe allergic reaction, other serious injury, or death. 5. What if there is a serious problem? An allergic reaction could occur after the vaccinated person leaves the clinic. If you see signs of a severe allergic reaction (hives, swelling of the face and throat, difficulty breathing, a fast heartbeat, dizziness, or weakness), call 9-1-1 and get the person to the nearest hospital. For other signs that concern you, call your health care provider.  Adverse reactions should  be reported to the Vaccine Adverse Event Reporting System (VAERS). Your health care provider will usually file this  report, or you can do it yourself. Visit the VAERS website at www.vaers.LAgents.no or call 760 740 9843. VAERS is only for reporting reactions, and VAERS staff do not give medical advice. 6. The National Vaccine Injury Compensation Program The [employer] Vaccine Injury Compensation Program (VICP) is a federal program that was created to compensate people who may have been injured by certain vaccines. Visit the VICP website at SpiritualWord.at or call (772) 094-8139 to learn about the program and about filing a claim. There is a time limit to file a claim for compensation. 7. How can I learn more?  Ask your health care provider.  Call your local or state health department.  Contact the Centers for Disease Control and Prevention (CDC): ? Call 984-447-0456 (1-800-CDC-INFO) or ? Visit CDC's website at PicCapture.uy Vaccine Information Statement Tdap (Tetanus, Diphtheria, Pertussis) Vaccine (10/24/2018) This information is not intended to replace advice given to you by your health care provider. Make sure you discuss any questions you have with your health care provider. Document Revised: 11/02/2018 Document Reviewed: 11/05/2018 Elsevier Patient Education  2020 [employer].

## 2019-10-23 LAB — COMPREHENSIVE METABOLIC PANEL
ALT: 22 IU/L (ref 0–32)
AST: 11 IU/L (ref 0–40)
Albumin/Globulin Ratio: 2.1 (ref 1.2–2.2)
Albumin: 4 g/dL (ref 3.7–4.7)
Alkaline Phosphatase: 58 IU/L (ref 39–117)
BUN/Creatinine Ratio: 18 (ref 12–28)
BUN: 16 mg/dL (ref 8–27)
Bilirubin Total: 0.5 mg/dL (ref 0.0–1.2)
CO2: 25 mmol/L (ref 20–29)
Calcium: 9.1 mg/dL (ref 8.7–10.3)
Chloride: 103 mmol/L (ref 96–106)
Creatinine, Ser: 0.88 mg/dL (ref 0.57–1.00)
GFR calc Af Amer: 75 mL/min/{1.73_m2} (ref >59)
GFR calc non Af Amer: 65 mL/min/{1.73_m2} (ref >59)
Globulin, Total: 1.9 g/dL (ref 1.5–4.5)
Glucose: 95 mg/dL (ref 65–99)
Potassium: 4.6 mmol/L (ref 3.5–5.2)
Sodium: 139 mmol/L (ref 134–144)
Total Protein: 5.9 g/dL — ABNORMAL LOW (ref 6.0–8.5)

## 2019-10-23 LAB — LIPID PANEL
Chol/HDL Ratio: 2.4 ratio (ref 0.0–4.4)
Cholesterol, Total: 218 mg/dL — ABNORMAL HIGH (ref 100–199)
HDL: 92 mg/dL (ref >39)
LDL Chol Calc (NIH): 105 mg/dL — ABNORMAL HIGH (ref 0–99)
Triglycerides: 124 mg/dL (ref 0–149)
VLDL Cholesterol Cal: 21 mg/dL (ref 5–40)

## 2019-10-23 LAB — TSH: TSH: 0.448 u[IU]/mL — ABNORMAL LOW (ref 0.450–4.500)

## 2019-10-23 LAB — HEMOGLOBIN A1C
Est. average glucose Bld gHb Est-mCnc: 114 mg/dL
Hgb A1c MFr Bld: 5.6 % (ref 4.8–5.6)

## 2019-10-24 LAB — URINE CULTURE

## 2019-10-25 ENCOUNTER — Other Ambulatory Visit: Payer: Self-pay | Admitting: Internal Medicine

## 2019-10-25 DIAGNOSIS — N3000 Acute cystitis without hematuria: Secondary | ICD-10-CM

## 2019-10-25 MED ORDER — NITROFURANTOIN MONOHYD MACRO 100 MG PO CAPS: 100.0000 mg | ORAL_CAPSULE | Freq: Two times a day (BID) | ORAL | 0 refills | Status: AC

## 2019-11-15 ENCOUNTER — Other Ambulatory Visit: Payer: Self-pay

## 2019-11-15 ENCOUNTER — Ambulatory Visit (INDEPENDENT_AMBULATORY_CARE_PROVIDER_SITE_OTHER): Payer: Medicare Other | Admitting: Internal Medicine

## 2019-11-15 ENCOUNTER — Encounter: Payer: Self-pay | Admitting: Internal Medicine

## 2019-11-15 ENCOUNTER — Telehealth: Payer: Self-pay | Admitting: Internal Medicine

## 2019-11-15 VITALS — BP 102/78 | HR 82 | Temp 97.3°F | Ht 64.0 in | Wt 183.0 lb

## 2019-11-15 DIAGNOSIS — J4540 Moderate persistent asthma, uncomplicated: Secondary | ICD-10-CM

## 2019-11-15 DIAGNOSIS — R8290 Unspecified abnormal findings in urine: Secondary | ICD-10-CM | POA: Diagnosis not present

## 2019-11-15 DIAGNOSIS — R21 Rash and other nonspecific skin eruption: Secondary | ICD-10-CM | POA: Diagnosis not present

## 2019-11-15 DIAGNOSIS — Z7952 Long term (current) use of systemic steroids: Secondary | ICD-10-CM

## 2019-11-15 LAB — POCT URINALYSIS DIPSTICK
Bilirubin, UA: NEGATIVE
Blood, UA: NEGATIVE
Glucose, UA: NEGATIVE
Ketones, UA: NEGATIVE
Nitrite, UA: POSITIVE
Protein, UA: NEGATIVE
Spec Grav, UA: 1.015 (ref 1.010–1.025)
Urobilinogen, UA: 0.2 E.U./dL
pH, UA: 5 (ref 5.0–8.0)

## 2019-11-15 MED ORDER — CEPHALEXIN 500 MG PO CAPS: 500.0000 mg | ORAL_CAPSULE | Freq: Four times a day (QID) | ORAL | 0 refills | Status: AC

## 2019-11-15 NOTE — Telephone Encounter (Signed)
Medication Refill - Medication: albuterol (PROVENTIL) (2.5 MG/3ML) 0.083% nebulizer solution 2.5 mg inhaler    Preferred Pharmacy (with phone number or street name):  [facility] [address] [place] - [address] Phone:  [phone]  Fax:  [phone]       Agent: Please be advised that RX refills may take up to 3 business days. We ask that you follow-up with your pharmacy.

## 2019-11-15 NOTE — Progress Notes (Signed)
Date:  11/15/2019   Name:  Hayley Ewing   DOB:  [birth year]   MRN:  [id number]   Chief Complaint: swelling in face (2.5 weeks. last few days it has gotten worse. )  HPI  Facial swelling - she started to notice her face was swelling several weeks ago but it has become more noticeable.  She has only dentures, no teeth and no oral pain.  No trouble swallowing, chewing or breathing.  No fever, chills or pain.   She has been on prednisone 10-30 mg for the last 6 months.  Urine odor - she has hx of UTI and has noticed urine odor recently.  Right wrist/forearm - 2 days ago noticed an area of redness and raised lesion without itching or pain.  No drainage.  No known injury.  Lab Results  Component Value Date   CREATININE 0.88 10/22/2019   BUN 16 10/22/2019   NA 139 10/22/2019   K 4.6 10/22/2019   CL 103 10/22/2019   CO2 25 10/22/2019   Lab Results  Component Value Date   CHOL 218 (H) 10/22/2019   HDL 92 10/22/2019   LDLCALC 105 (H) 10/22/2019   TRIG 124 10/22/2019   CHOLHDL 2.4 10/22/2019   Lab Results  Component Value Date   TSH 0.448 (L) 10/22/2019   Lab Results  Component Value Date   HGBA1C 5.6 10/22/2019   Lab Results  Component Value Date   WBC 4.7 11/01/2016   HGB 10.8 (L) 11/01/2016   HCT 34.5 11/01/2016   MCV 74 (L) 11/01/2016   PLT 274 11/01/2016   Lab Results  Component Value Date   ALT 22 10/22/2019   AST 11 10/22/2019   ALKPHOS 58 10/22/2019   BILITOT 0.5 10/22/2019     Review of Systems  Constitutional: Negative for chills, diaphoresis, fatigue and fever.  HENT: Negative for congestion, dental problem, postnasal drip and sinus pressure.   Eyes: Positive for redness (area on right orbit is new). Negative for visual disturbance.  Respiratory: Positive for shortness of breath (chronic). Negative for cough, chest tightness and wheezing.   Cardiovascular: Negative for chest pain.  Genitourinary: Positive for frequency and urgency. Negative for  difficulty urinating and dysuria.  Neurological: Negative for dizziness, light-headedness and headaches.    Patient Active Problem List   Diagnosis Date Noted  . Generalized osteoarthritis 10/22/2019  . Heart failure with reduced ejection fraction ([place]) 09/16/2019  . Chondrocalcinosis 01/15/2018  . Rheumatoid arthritis ([place]) 01/15/2018  . Colitis 08/31/2017  . Essential hypertension 08/10/2017  . Asthma, persistent controlled 07/10/2017  . Displacement of lumbar intervertebral disc without myelopathy 01/30/2017  . Venous insufficiency of both lower extremities 06/14/2016  . Bladder cystocele 05/19/2015  . S/P hip replacement, right 05/19/2015    No Known Allergies  Past Surgical History:  Procedure Laterality Date  . TOTAL ABDOMINAL HYSTERECTOMY  1998  . TOTAL HIP ARTHROPLASTY Right 2014    Social History   Tobacco Use  . Smoking status: Never Smoker  . Smokeless tobacco: Never Used  . Tobacco comment: smoking cessation materials not required  Substance Use Topics  . Alcohol use: Not Currently  . Drug use: No     Medication list has been reviewed and updated.  Current Meds  Medication Sig  . carvedilol (COREG) 12.5 MG tablet Take 12.5 mg by mouth 2 (two) times daily with a meal.   . DULoxetine (CYMBALTA) 60 MG capsule Take 60 mg by mouth daily.  . folic acid (  FOLVITE) 1 MG tablet   . furosemide (LASIX) 20 MG tablet Take 1 tablet (20 mg total) by mouth daily as needed.  Marland Kitchen HYDROcodone-acetaminophen (NORCO) 10-325 MG tablet Take 1 tablet by mouth 2 (two) times daily.   Marland Kitchen ibuprofen (ADVIL,MOTRIN) 600 MG tablet Take 600 mg by mouth 2 (two) times daily.  Marland Kitchen lidocaine (LIDODERM) 5 % Place 1 patch onto the skin daily. Remove & Discard patch within 12 hours or as directed by MD  . lisinopril (ZESTRIL) 40 MG tablet Take 40 mg by mouth daily.  . methotrexate (RHEUMATREX) 2.5 MG tablet Take 4 tablets by mouth once a week. Rheumatoid Arthritis- Dr Mordecai Maes.  Marland Kitchen NARCAN 4 MG/0.1ML  LIQD nasal spray kit SMARTSIG:1 Spray(s) Both Nares Once PRN  . nystatin (MYCOSTATIN) 100000 UNIT/ML suspension Take 5 mLs (500,000 Units total) by mouth 4 (four) times daily. Swish, gargle and spit  . predniSONE (DELTASONE) 10 MG tablet prednisone 10 mg tablet  TAKE ONE TO FOUR TABLETS BY MOUTH AS NEEDED FOR ARTHRITIS FLARE   Current Facility-Administered Medications for the 11/15/19 encounter (Office Visit) with Glean Hess, MD  Medication  . albuterol (PROVENTIL) (2.5 MG/3ML) 0.083% nebulizer solution 2.5 mg    PHQ 2/9 Scores 11/15/2019 10/22/2019 10/04/2019 08/21/2018  PHQ - 2 Score 2 0 0 0  PHQ- 9 Score 2 6 - -    BP Readings from Last 3 Encounters:  11/15/19 102/78  10/22/19 130/90  08/21/18 120/68    Physical Exam Vitals and nursing note reviewed.  Constitutional:      General: She is not in acute distress.    Appearance: She is well-developed.  HENT:     Head: Normocephalic and atraumatic.     Jaw: No tenderness or pain on movement.     Comments: Moon facies noted - bilateral and symetrical Eyes:     General: Lids are normal.      Comments: Small hemorrhage  Neck:     Vascular: No carotid bruit or JVD.     Comments: BP and pulses equal bilaterally Cardiovascular:     Rate and Rhythm: Normal rate and regular rhythm.  No extrasystoles are present.    Heart sounds: Normal heart sounds.  Pulmonary:     Effort: Pulmonary effort is normal. No respiratory distress.     Breath sounds: No wheezing or rhonchi.  Musculoskeletal:        General: Normal range of motion.     Cervical back: Normal range of motion.     Right lower leg: No edema.     Left lower leg: No edema.  Lymphadenopathy:     Cervical: No cervical adenopathy.  Skin:    General: Skin is warm and dry.     Findings: Signs of injury and rash present. Rash is nodular.          Comments: Red raised lesion on arm c/w insect bite - no drainage or fluctuance  Shallow 0.5 cm ulcer on right ankle -     Neurological:     Mental Status: She is alert and oriented to person, place, and time.  Psychiatric:        Attention and Perception: Attention normal.        Behavior: Behavior normal.        Thought Content: Thought content normal.     Wt Readings from Last 3 Encounters:  11/15/19 183 lb (83 kg)  10/22/19 184 lb (83.5 kg)  08/21/18 184 lb (83.5 kg)  BP 102/78   Pulse 82   Temp (!) 97.3 F (36.3 C) (Temporal)   Ht _0  (1.626 m)   Wt 183 lb (83 kg)   SpO2 96%   BMI 31.41 kg/m   Assessment and Plan: 1. 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 Abnormal urine odor - POCT urinalysis dipstick - positive for nitrates and leukocytes - cephALEXin (KEFLEX) 500 MG capsule; Take 1 capsule (500 mg total) by mouth 4 (four) times daily for 10 days.  Dispense: 40 capsule; Refill: 0  3. Rash and nonspecific skin eruption - cephALEXin (KEFLEX) 500 MG capsule; Take 1 capsule (500 mg total) by mouth 4 (four) times daily for 10 days.  Dispense: 40 capsule; Refill: 0   Partially dictated using Editor, commissioning. Any errors are unintentional.  Halina Maidens, MD [facility]  11/15/2019

## 2019-11-18 ENCOUNTER — Other Ambulatory Visit: Payer: Self-pay | Admitting: Internal Medicine

## 2019-11-18 ENCOUNTER — Telehealth: Payer: Self-pay

## 2019-11-18 ENCOUNTER — Ambulatory Visit (INDEPENDENT_AMBULATORY_CARE_PROVIDER_SITE_OTHER): Payer: Medicare Other

## 2019-11-18 DIAGNOSIS — Z0000 Encounter for general adult medical examination without abnormal findings: Secondary | ICD-10-CM | POA: Diagnosis not present

## 2019-11-18 DIAGNOSIS — Z23 Encounter for immunization: Secondary | ICD-10-CM | POA: Diagnosis not present

## 2019-11-18 DIAGNOSIS — B370 Candidal stomatitis: Secondary | ICD-10-CM

## 2019-11-18 MED ORDER — NYSTATIN 100000 UNIT/ML MT SUSP: 5.0000 mL | Freq: Four times a day (QID) | OROMUCOSAL | 0 refills | Status: DC

## 2019-11-18 NOTE — Telephone Encounter (Signed)
Medicare AWV completed virtually today. Patient requesting refill for nystatin suspension. Pt seen in office 11/15/19 and forgot to request refill. Please advise or send refill to [facility] on file. Thank you!

## 2019-11-18 NOTE — Progress Notes (Signed)
Subjective:   Hayley Ewing is a 74 y.o. female who presents for Medicare Annual (Subsequent) preventive examination.  Review of Systems:   Cardiac Risk Factors include: advanced age (>66mn, >>62women);hypertension;obesity (BMI >30kg/m2);sedentary lifestyle     Objective:     Vitals: There were no vitals taken for this visit.  There is no height or weight on file to calculate BMI.  Advanced Directives 11/18/2019 11/09/2017 11/01/2016 11/16/2015  Does Patient Have a Medical Advance Directive? No No No No  Would patient like information on creating a medical advance directive? No - Patient declined - - No - patient declined information    Tobacco Social History   Tobacco Use  Smoking Status Never Smoker  Smokeless Tobacco Never Used  Tobacco Comment   smoking cessation materials not required     Counseling given: Not Answered Comment: smoking cessation materials not required   Clinical Intake:  Pre-visit preparation completed: Yes  Pain : No/denies pain     Nutritional Risks: None Diabetes: No  How often do you need to have someone help you when you read instructions, pamphlets, or other written materials from your doctor or pharmacy?: 1 - Never  Interpreter Needed?: No  Information entered by :: KClemetine MarkerLPN  Past Medical History:  Diagnosis Date  . Asthma   . Bursitis   . Cardiomegaly   . Female cystocele   . Hypertension   . Osteoarthritis   . Sinusitis   . Venous insufficiency    Past Surgical History:  Procedure Laterality Date  . TOTAL ABDOMINAL HYSTERECTOMY  1998  . TOTAL HIP ARTHROPLASTY Right 2014   Family History  Problem Relation Age of Onset  . Brain cancer Mother   . Brain cancer Father    Social History   Socioeconomic History  . Marital status: Married    Spouse name: SRichardson Landry . Number of children: 3  . Years of education: some college  . Highest education level: 12th grade  Occupational History  . Occupation: Retired    Tobacco Use  . Smoking status: Never Smoker  . Smokeless tobacco: Never Used  . Tobacco comment: smoking cessation materials not required  Substance and Sexual Activity  . Alcohol use: Not Currently  . Drug use: No  . Sexual activity: Not Currently  Other Topics Concern  . Not on file  Social History Narrative  . Not on file   Social Determinants of Health   Financial Resource Strain: Low Risk   . Difficulty of Paying Living Expenses: Not hard at all  Food Insecurity: No Food Insecurity  . Worried About RCharity fundraiserin the Last Year: Never true  . Ran Out of Food in the Last Year: Never true  Transportation Needs: No Transportation Needs  . Lack of Transportation (Medical): No  . Lack of Transportation (Non-Medical): No  Physical Activity: Inactive  . Days of Exercise per Week: 0 days  . Minutes of Exercise per Session: 0 min  Stress: No Stress Concern Present  . Feeling of Stress : Not at all  Social Connections: Unknown  . Frequency of Communication with Friends and Family: Patient refused  . Frequency of Social Gatherings with Friends and Family: Patient refused  . Attends Religious Services: Patient refused  . Active Member of Clubs or Organizations: Patient refused  . Attends CArchivistMeetings: Patient refused  . Marital Status: Married    Outpatient Encounter Medications as of 11/18/2019  Medication Sig  . carvedilol (  COREG) 12.5 MG tablet Take 12.5 mg by mouth 2 (two) times daily with a meal.   . cephALEXin (KEFLEX) 500 MG capsule Take 1 capsule (500 mg total) by mouth 4 (four) times daily for 10 days.  . DULoxetine (CYMBALTA) 60 MG capsule Take 60 mg by mouth daily.  . folic acid (FOLVITE) 1 MG tablet   . HYDROcodone-acetaminophen (NORCO) 10-325 MG tablet Take 1 tablet by mouth 2 (two) times daily.   Marland Kitchen lidocaine (LIDODERM) 5 % Place 1 patch onto the skin daily. Remove & Discard patch within 12 hours or as directed by MD  . lisinopril (ZESTRIL)  40 MG tablet Take 40 mg by mouth daily.  . methotrexate (RHEUMATREX) 2.5 MG tablet Take 4 tablets by mouth once a week. Rheumatoid Arthritis- Dr Mordecai Maes.  . predniSONE (DELTASONE) 10 MG tablet prednisone 10 mg tablet  TAKE ONE TO FOUR TABLETS BY MOUTH AS NEEDED FOR ARTHRITIS FLARE  . furosemide (LASIX) 20 MG tablet Take 1 tablet (20 mg total) by mouth daily as needed.  Marland Kitchen NARCAN 4 MG/0.1ML LIQD nasal spray kit SMARTSIG:1 Spray(s) Both Nares Once PRN  . nystatin (MYCOSTATIN) 100000 UNIT/ML suspension Take 5 mLs (500,000 Units total) by mouth 4 (four) times daily. Swish, gargle and spit  . spironolactone (ALDACTONE) 25 MG tablet Take 25 mg by mouth daily.   . [DISCONTINUED] ibuprofen (ADVIL,MOTRIN) 600 MG tablet Take 600 mg by mouth 2 (two) times daily.   Facility-Administered Encounter Medications as of 11/18/2019  Medication  . albuterol (PROVENTIL) (2.5 MG/3ML) 0.083% nebulizer solution 2.5 mg    Activities of Daily Living In your present state of health, do you have any difficulty performing the following activities: 11/18/2019 10/22/2019  Hearing? N N  Comment declines hearing aids -  Vision? N N  Difficulty concentrating or making decisions? N N  Walking or climbing stairs? N N  Dressing or bathing? N N  Doing errands, shopping? N N  Preparing Food and eating ? N -  Using the Toilet? N -  In the past six months, have you accidently leaked urine? N -  Do you have problems with loss of bowel control? N -  Managing your Medications? N -  Managing your Finances? N -  Housekeeping or managing your Housekeeping? N -  Some recent data might be hidden    Patient Care Team: Glean Hess, MD as PCP - General (Internal Medicine) [place], Jimmy J, MD as Referring Physician (Physical Medicine and Rehabilitation) Blazing, Venia Carbon, MD as Referring Physician (Internal Medicine) Hoyle Sauer, MD as Referring Physician (Rheumatology)    Assessment:   This is a routine wellness  examination for Hayley Ewing.  Exercise Activities and Dietary recommendations Current Exercise Habits: The patient does not participate in regular exercise at present, Exercise limited by: orthopedic condition(s)  Goals    . DIET - INCREASE WATER INTAKE     Recommend to drink at least 6-8 8oz glasses of water per day.    . Prevent falls     Recommend to remove any items from the home that may cause slips or trips.        Fall Risk Fall Risk  11/18/2019 11/15/2019 10/22/2019 08/21/2018 11/09/2017  Falls in the past year? 0 0 1 0 Yes  Comment - - - - tripped over stool  Number falls in past yr: 0 0 1 0 1  Comment - - - - -  Injury with Fall? 0 0 1 0 No  Comment - - - - -  Risk Factor Category  - - - - -  Risk for fall due to : No Fall Risks No Fall Risks History of fall(s) - Impaired balance/gait;Impaired vision;Medication side effect;History of fall(s)  Risk for fall due to: Comment - - - - chronic pain, cataracts, wears eyeglasses, narcotic use; ambulates with a broken wooden cane  Follow up Falls prevention discussed Falls evaluation completed Falls evaluation completed;Falls prevention discussed Falls evaluation completed Falls evaluation completed;Education provided;Falls prevention discussed   FALL RISK PREVENTION PERTAINING TO THE HOME:  Any stairs in or around the home? Yes  If so, do they handrails? Yes   Home free of loose throw rugs in walkways, pet beds, electrical cords, etc? Yes  Adequate lighting in your home to reduce risk of falls? Yes   ASSISTIVE DEVICES UTILIZED TO PREVENT FALLS:  Life alert? No  Use of a cane, walker or w/c? Yes  Grab bars in the bathroom? Yes  Shower chair or bench in shower? Yes  Elevated toilet seat or a handicapped toilet? No   DME ORDERS:  DME order needed?  No   TIMED UP AND GO:  Was the test performed? No . Telephonic visit.   Education: Fall risk prevention has been discussed.  Intervention(s) required? No    Depression  Screen PHQ 2/9 Scores 11/18/2019 11/15/2019 10/22/2019 10/04/2019  PHQ - 2 Score 2 2 0 0  PHQ- 9 Score 2 2 6 -     Cognitive Function Pt declines 6CIT for 2021 AWV.      6CIT Screen 11/09/2017 11/01/2016  What Year? 0 points 0 points  What month? 3 points 0 points  What time? 0 points 0 points  Count back from 20 0 points 0 points  Months in reverse 4 points 4 points  Repeat phrase 0 points 0 points  Total Score 7 4    Immunization History  Administered Date(s) Administered  . Pneumococcal Conjugate-13 11/01/2016  . Pneumococcal Polysaccharide-23 11/09/2017  . Tdap 04/30/2008, 10/22/2019    Qualifies for Shingles Vaccine? No  taking methotrexate  Tdap: Up to date  Flu Vaccine: Due for Flu vaccine. Does the patient want to receive this vaccine today?  No . Education has been provided regarding the importance of this vaccine but still declined. Advised may receive this vaccine at local pharmacy or Health Dept. Aware to provide a copy of the vaccination record if obtained from local pharmacy or Health Dept. Verbalized acceptance and understanding.  Pneumococcal Vaccine: Up to date   Screening Tests Health Maintenance  Topic Date Due  . COLONOSCOPY  08/27/2018  . MAMMOGRAM  08/29/2019  . COVID-19 Vaccine (1) 12/01/2019 (Originally 10/16/1961)  . INFLUENZA VACCINE  02/23/2020  . TETANUS/TDAP  10/21/2029  . Hepatitis C Screening  Completed  . PNA vac Low Risk Adult  Completed  . DEXA SCAN  Discontinued    Cancer Screenings:  Colorectal Screening: Completed 08/27/08. Repeat every 10 years; Pt states she is in process of having this scheduled.   Mammogram: Completed 08/28/18. Repeat every year. Ordered 10/22/19. Pt provided with contact information and advised to call to schedule appt.   Bone Density: Completed 2002. Results reflect NORMAL. Repeat every 2 years. Pt declines repeat screening at this time.  Lung Cancer Screening: (Low Dose CT Chest recommended if Age 55-80 years, 30  pack-year currently smoking OR have quit w/in 15years.) does not qualify.   Additional Screening:  Hepatitis C Screening: does qualify; Completed 11/01/16.  Vision Screening: Recommended annual ophthalmology exams for   early detection of glaucoma and other disorders of the eye. Is the patient up to date with their annual eye exam?  No  - postponed due to Covid Who is the provider or what is the name of the office in which the pt attends annual eye exams? [facility]  Dental Screening: Recommended annual dental exams for proper oral hygiene  Community Resource Referral:  CRR required this visit?  No      Plan:     I have personally reviewed and addressed the Medicare Annual Wellness questionnaire and have noted the following in the patient's chart:  A. Medical and social history B. Use of alcohol, tobacco or illicit drugs  C. Current medications and supplements D. Functional ability and status E.  Nutritional status F.  Physical activity G. Advance directives H. List of other physicians I.  Hospitalizations, surgeries, and ER visits in previous 12 months J.  Vitals K. Screenings such as hearing and vision if needed, cognitive and depression L. Referrals and appointments   In addition, I have reviewed and discussed with patient certain preventive protocols, quality metrics, and best practice recommendations. A written personalized care plan for preventive services as well as general preventive health recommendations were provided to patient.   Signed,  Kasey Uthus, LPN Nurse Health Advisor   Nurse Notes: pt states she needs refill on nystatin suspension. Telephone note sent to Dr. Berglund.   

## 2019-11-18 NOTE — Patient Instructions (Signed)
Hayley Ewing , Thank you for taking time to come for your Medicare Wellness Visit. I appreciate your ongoing commitment to your health goals. Please review the following plan we discussed and let me know if I can assist you in the future.   Screening recommendations/referrals: Colonoscopy: done 08/27/08 Mammogram: done 08/28/18. Please call [phone] to schedule your mammogram.  Bone Density: done 2002 Recommended yearly ophthalmology/optometry visit for glaucoma screening and checkup Recommended yearly dental visit for hygiene and checkup  Vaccinations: Influenza vaccine: postponed Pneumococcal vaccine: done 11/09/17 Tdap vaccine: done 10/22/19 Covid-19: scheduled for first dose 11/18/19  Advanced directives: Advance directive discussed with you today. Even though you declined this today please call our office should you change your mind and we can give you the proper paperwork for you to fill out.  Conditions/risks identified: Recommend increasing physical activity as tolerated  Next appointment: Please follow up in one year for your Medicare Annual Wellness visit.     Preventive Care 28 Years and Older, Female Preventive care refers to lifestyle choices and visits with your health care provider that can promote health and wellness. What does preventive care include?  A yearly physical exam. This is also called an annual well check.  Dental exams once or twice a year.  Routine eye exams. Ask your health care provider how often you should have your eyes checked.  Personal lifestyle choices, including:  Daily care of your teeth and gums.  Regular physical activity.  Eating a healthy diet.  Avoiding tobacco and drug use.  Limiting alcohol use.  Practicing safe sex.  Taking low-dose aspirin every day.  Taking vitamin and mineral supplements as recommended by your health care provider. What happens during an annual well check? The services and screenings done by your  health care provider during your annual well check will depend on your age, overall health, lifestyle risk factors, and family history of disease. Counseling  Your health care provider may ask you questions about your:  Alcohol use.  Tobacco use.  Drug use.  Emotional well-being.  Home and relationship well-being.  Sexual activity.  Eating habits.  History of falls.  Memory and ability to understand (cognition).  Work and work Astronomer.  Reproductive health. Screening  You may have the following tests or measurements:  Height, weight, and BMI.  Blood pressure.  Lipid and cholesterol levels. These may be checked every 5 years, or more frequently if you are over 22 years old.  Skin check.  Lung cancer screening. You may have this screening every year starting at age 22 if you have a 30-pack-year history of smoking and currently smoke or have quit within the past 15 years.  Fecal occult blood test (FOBT) of the stool. You may have this test every year starting at age 30.  Flexible sigmoidoscopy or colonoscopy. You may have a sigmoidoscopy every 5 years or a colonoscopy every 10 years starting at age 62.  Hepatitis C blood test.  Hepatitis B blood test.  Sexually transmitted disease (STD) testing.  Diabetes screening. This is done by checking your blood sugar (glucose) after you have not eaten for a while (fasting). You may have this done every 1-3 years.  Bone density scan. This is done to screen for osteoporosis. You may have this done starting at age 94.  Mammogram. This may be done every 1-2 years. Talk to your health care provider about how often you should have regular mammograms. Talk with your health care provider about your test results,  treatment options, and if necessary, the need for more tests. Vaccines  Your health care provider may recommend certain vaccines, such as:  Influenza vaccine. This is recommended every year.  Tetanus, diphtheria, and  acellular pertussis (Tdap, Td) vaccine. You may need a Td booster every 10 years.  Zoster vaccine. You may need this after age 74.  Pneumococcal 13-valent conjugate (PCV13) vaccine. One dose is recommended after age 55.  Pneumococcal polysaccharide (PPSV23) vaccine. One dose is recommended after age 59. Talk to your health care provider about which screenings and vaccines you need and how often you need them. This information is not intended to replace advice given to you by your health care provider. Make sure you discuss any questions you have with your health care provider. Document Released: 08/07/2015 Document Revised: 03/30/2016 Document Reviewed: 05/12/2015 Elsevier Interactive Patient Education  2017 [employer] Prevention in the Home Falls can cause injuries. They can happen to people of all ages. There are many things you can do to make your home safe and to help prevent falls. What can I do on the outside of my home?  Regularly fix the edges of walkways and driveways and fix any cracks.  Remove anything that might make you trip as you walk through a door, such as a raised step or threshold.  Trim any bushes or trees on the path to your home.  Use bright outdoor lighting.  Clear any walking paths of anything that might make someone trip, such as rocks or tools.  Regularly check to see if handrails are loose or broken. Make sure that both sides of any steps have handrails.  Any raised decks and porches should have guardrails on the edges.  Have any leaves, snow, or ice cleared regularly.  Use sand or salt on walking paths during winter.  Clean up any spills in your garage right away. This includes oil or grease spills. What can I do in the bathroom?  Use night lights.  Install grab bars by the toilet and in the tub and shower. Do not use towel bars as grab bars.  Use non-skid mats or decals in the tub or shower.  If you need to sit down in the shower, use  a plastic, non-slip stool.  Keep the floor dry. Clean up any water that spills on the floor as soon as it happens.  Remove soap buildup in the tub or shower regularly.  Attach bath mats securely with double-sided non-slip rug tape.  Do not have throw rugs and other things on the floor that can make you trip. What can I do in the bedroom?  Use night lights.  Make sure that you have a light by your bed that is easy to reach.  Do not use any sheets or blankets that are too big for your bed. They should not hang down onto the floor.  Have a firm chair that has side arms. You can use this for support while you get dressed.  Do not have throw rugs and other things on the floor that can make you trip. What can I do in the kitchen?  Clean up any spills right away.  Avoid walking on wet floors.  Keep items that you use a lot in easy-to-reach places.  If you need to reach something above you, use a strong step stool that has a grab bar.  Keep electrical cords out of the way.  Do not use floor polish or wax that makes floors  slippery. If you must use wax, use non-skid floor wax.  Do not have throw rugs and other things on the floor that can make you trip. What can I do with my stairs?  Do not leave any items on the stairs.  Make sure that there are handrails on both sides of the stairs and use them. Fix handrails that are broken or loose. Make sure that handrails are as long as the stairways.  Check any carpeting to make sure that it is firmly attached to the stairs. Fix any carpet that is loose or worn.  Avoid having throw rugs at the top or bottom of the stairs. If you do have throw rugs, attach them to the floor with carpet tape.  Make sure that you have a light switch at the top of the stairs and the bottom of the stairs. If you do not have them, ask someone to add them for you. What else can I do to help prevent falls?  Wear shoes that:  Do not have high heels.  Have  rubber bottoms.  Are comfortable and fit you well.  Are closed at the toe. Do not wear sandals.  If you use a stepladder:  Make sure that it is fully opened. Do not climb a closed stepladder.  Make sure that both sides of the stepladder are locked into place.  Ask someone to hold it for you, if possible.  Clearly mark and make sure that you can see:  Any grab bars or handrails.  First and last steps.  Where the edge of each step is.  Use tools that help you move around (mobility aids) if they are needed. These include:  Canes.  Walkers.  Scooters.  Crutches.  Turn on the lights when you go into a dark area. Replace any light bulbs as soon as they burn out.  Set up your furniture so you have a clear path. Avoid moving your furniture around.  If any of your floors are uneven, fix them.  If there are any pets around you, be aware of where they are.  Review your medicines with your doctor. Some medicines can make you feel dizzy. This can increase your chance of falling. Ask your doctor what other things that you can do to help prevent falls. This information is not intended to replace advice given to you by your health care provider. Make sure you discuss any questions you have with your health care provider. Document Released: 05/07/2009 Document Revised: 12/17/2015 Document Reviewed: 08/15/2014 Elsevier Interactive Patient Education  2017 [employer].

## 2019-11-19 ENCOUNTER — Other Ambulatory Visit: Payer: Self-pay | Admitting: Internal Medicine

## 2019-11-19 DIAGNOSIS — Z1211 Encounter for screening for malignant neoplasm of colon: Secondary | ICD-10-CM

## 2019-11-25 DIAGNOSIS — Z79899 Other long term (current) drug therapy: Secondary | ICD-10-CM | POA: Diagnosis not present

## 2019-11-25 DIAGNOSIS — M19019 Primary osteoarthritis, unspecified shoulder: Secondary | ICD-10-CM | POA: Diagnosis not present

## 2019-11-25 DIAGNOSIS — G894 Chronic pain syndrome: Secondary | ICD-10-CM | POA: Diagnosis not present

## 2019-11-25 DIAGNOSIS — M5126 Other intervertebral disc displacement, lumbar region: Secondary | ICD-10-CM | POA: Diagnosis not present

## 2019-11-25 DIAGNOSIS — M179 Osteoarthritis of knee, unspecified: Secondary | ICD-10-CM | POA: Diagnosis not present

## 2019-11-25 DIAGNOSIS — M159 Polyosteoarthritis, unspecified: Secondary | ICD-10-CM | POA: Diagnosis not present

## 2019-11-25 DIAGNOSIS — Z79891 Long term (current) use of opiate analgesic: Secondary | ICD-10-CM | POA: Diagnosis not present

## 2019-11-25 DIAGNOSIS — M47817 Spondylosis without myelopathy or radiculopathy, lumbosacral region: Secondary | ICD-10-CM | POA: Diagnosis not present

## 2019-11-25 DIAGNOSIS — F418 Other specified anxiety disorders: Secondary | ICD-10-CM | POA: Diagnosis not present

## 2019-11-25 DIAGNOSIS — M5416 Radiculopathy, lumbar region: Secondary | ICD-10-CM | POA: Diagnosis not present

## 2019-11-25 DIAGNOSIS — M25519 Pain in unspecified shoulder: Secondary | ICD-10-CM | POA: Diagnosis not present

## 2019-11-29 DIAGNOSIS — M8580 Other specified disorders of bone density and structure, unspecified site: Secondary | ICD-10-CM | POA: Diagnosis not present

## 2019-11-29 DIAGNOSIS — M25561 Pain in right knee: Secondary | ICD-10-CM | POA: Diagnosis not present

## 2019-11-29 DIAGNOSIS — F418 Other specified anxiety disorders: Secondary | ICD-10-CM | POA: Diagnosis not present

## 2019-11-29 DIAGNOSIS — M25562 Pain in left knee: Secondary | ICD-10-CM | POA: Diagnosis not present

## 2019-11-29 DIAGNOSIS — M179 Osteoarthritis of knee, unspecified: Secondary | ICD-10-CM | POA: Diagnosis not present

## 2019-11-29 DIAGNOSIS — M19019 Primary osteoarthritis, unspecified shoulder: Secondary | ICD-10-CM | POA: Diagnosis not present

## 2019-11-29 DIAGNOSIS — M4316 Spondylolisthesis, lumbar region: Secondary | ICD-10-CM | POA: Diagnosis not present

## 2019-11-29 DIAGNOSIS — M47817 Spondylosis without myelopathy or radiculopathy, lumbosacral region: Secondary | ICD-10-CM | POA: Diagnosis not present

## 2019-11-29 DIAGNOSIS — M25519 Pain in unspecified shoulder: Secondary | ICD-10-CM | POA: Diagnosis not present

## 2019-11-29 DIAGNOSIS — Z79891 Long term (current) use of opiate analgesic: Secondary | ICD-10-CM | POA: Diagnosis not present

## 2019-11-29 DIAGNOSIS — Z79899 Other long term (current) drug therapy: Secondary | ICD-10-CM | POA: Diagnosis not present

## 2019-11-29 DIAGNOSIS — M5416 Radiculopathy, lumbar region: Secondary | ICD-10-CM | POA: Diagnosis not present

## 2019-11-29 DIAGNOSIS — M545 Low back pain: Secondary | ICD-10-CM | POA: Diagnosis not present

## 2019-11-29 DIAGNOSIS — M85861 Other specified disorders of bone density and structure, right lower leg: Secondary | ICD-10-CM | POA: Diagnosis not present

## 2019-11-29 DIAGNOSIS — M129 Arthropathy, unspecified: Secondary | ICD-10-CM | POA: Diagnosis not present

## 2019-11-29 DIAGNOSIS — M159 Polyosteoarthritis, unspecified: Secondary | ICD-10-CM | POA: Diagnosis not present

## 2019-11-29 DIAGNOSIS — M546 Pain in thoracic spine: Secondary | ICD-10-CM | POA: Diagnosis not present

## 2019-11-29 DIAGNOSIS — M791 Myalgia, unspecified site: Secondary | ICD-10-CM | POA: Diagnosis not present

## 2019-11-29 DIAGNOSIS — M5126 Other intervertebral disc displacement, lumbar region: Secondary | ICD-10-CM | POA: Diagnosis not present

## 2019-11-29 DIAGNOSIS — G894 Chronic pain syndrome: Secondary | ICD-10-CM | POA: Diagnosis not present

## 2019-11-29 DIAGNOSIS — M85862 Other specified disorders of bone density and structure, left lower leg: Secondary | ICD-10-CM | POA: Diagnosis not present

## 2019-11-29 DIAGNOSIS — M5134 Other intervertebral disc degeneration, thoracic region: Secondary | ICD-10-CM | POA: Diagnosis not present

## 2019-12-06 DIAGNOSIS — E882 Lipomatosis, not elsewhere classified: Secondary | ICD-10-CM | POA: Diagnosis not present

## 2019-12-06 DIAGNOSIS — M47817 Spondylosis without myelopathy or radiculopathy, lumbosacral region: Secondary | ICD-10-CM | POA: Diagnosis not present

## 2019-12-06 DIAGNOSIS — M5126 Other intervertebral disc displacement, lumbar region: Secondary | ICD-10-CM | POA: Diagnosis not present

## 2019-12-06 DIAGNOSIS — M47816 Spondylosis without myelopathy or radiculopathy, lumbar region: Secondary | ICD-10-CM | POA: Diagnosis not present

## 2019-12-08 DIAGNOSIS — R0602 Shortness of breath: Secondary | ICD-10-CM | POA: Diagnosis not present

## 2019-12-08 DIAGNOSIS — I82409 Acute embolism and thrombosis of unspecified deep veins of unspecified lower extremity: Secondary | ICD-10-CM | POA: Diagnosis not present

## 2019-12-08 DIAGNOSIS — M795 Residual foreign body in soft tissue: Secondary | ICD-10-CM | POA: Diagnosis not present

## 2019-12-08 DIAGNOSIS — N390 Urinary tract infection, site not specified: Secondary | ICD-10-CM | POA: Diagnosis not present

## 2019-12-08 DIAGNOSIS — R29898 Other symptoms and signs involving the musculoskeletal system: Secondary | ICD-10-CM | POA: Diagnosis not present

## 2019-12-08 DIAGNOSIS — I5042 Chronic combined systolic (congestive) and diastolic (congestive) heart failure: Secondary | ICD-10-CM | POA: Diagnosis not present

## 2019-12-08 DIAGNOSIS — R0600 Dyspnea, unspecified: Secondary | ICD-10-CM | POA: Diagnosis not present

## 2019-12-08 DIAGNOSIS — M25572 Pain in left ankle and joints of left foot: Secondary | ICD-10-CM | POA: Diagnosis not present

## 2019-12-08 DIAGNOSIS — I82432 Acute embolism and thrombosis of left popliteal vein: Secondary | ICD-10-CM | POA: Diagnosis not present

## 2019-12-08 DIAGNOSIS — M7989 Other specified soft tissue disorders: Secondary | ICD-10-CM | POA: Diagnosis not present

## 2019-12-08 DIAGNOSIS — R768 Other specified abnormal immunological findings in serum: Secondary | ICD-10-CM | POA: Diagnosis not present

## 2019-12-08 DIAGNOSIS — I959 Hypotension, unspecified: Secondary | ICD-10-CM | POA: Diagnosis not present

## 2019-12-08 DIAGNOSIS — I824Z3 Acute embolism and thrombosis of unspecified deep veins of distal lower extremity, bilateral: Secondary | ICD-10-CM | POA: Diagnosis not present

## 2019-12-08 DIAGNOSIS — M069 Rheumatoid arthritis, unspecified: Secondary | ICD-10-CM | POA: Diagnosis not present

## 2019-12-08 DIAGNOSIS — J45909 Unspecified asthma, uncomplicated: Secondary | ICD-10-CM | POA: Diagnosis not present

## 2019-12-08 DIAGNOSIS — M2550 Pain in unspecified joint: Secondary | ICD-10-CM | POA: Diagnosis not present

## 2019-12-08 DIAGNOSIS — I82412 Acute embolism and thrombosis of left femoral vein: Secondary | ICD-10-CM | POA: Diagnosis not present

## 2019-12-08 DIAGNOSIS — N179 Acute kidney failure, unspecified: Secondary | ICD-10-CM | POA: Diagnosis not present

## 2019-12-08 DIAGNOSIS — I5189 Other ill-defined heart diseases: Secondary | ICD-10-CM | POA: Diagnosis not present

## 2019-12-08 DIAGNOSIS — I872 Venous insufficiency (chronic) (peripheral): Secondary | ICD-10-CM | POA: Diagnosis not present

## 2019-12-08 DIAGNOSIS — M25571 Pain in right ankle and joints of right foot: Secondary | ICD-10-CM | POA: Diagnosis not present

## 2019-12-08 DIAGNOSIS — I10 Essential (primary) hypertension: Secondary | ICD-10-CM | POA: Diagnosis not present

## 2019-12-09 DIAGNOSIS — K5520 Angiodysplasia of colon without hemorrhage: Secondary | ICD-10-CM | POA: Diagnosis present

## 2019-12-09 DIAGNOSIS — N179 Acute kidney failure, unspecified: Secondary | ICD-10-CM | POA: Diagnosis present

## 2019-12-09 DIAGNOSIS — Z20822 Contact with and (suspected) exposure to covid-19: Secondary | ICD-10-CM | POA: Diagnosis present

## 2019-12-09 DIAGNOSIS — Z96641 Presence of right artificial hip joint: Secondary | ICD-10-CM | POA: Diagnosis present

## 2019-12-09 DIAGNOSIS — K644 Residual hemorrhoidal skin tags: Secondary | ICD-10-CM | POA: Diagnosis not present

## 2019-12-09 DIAGNOSIS — Z79899 Other long term (current) drug therapy: Secondary | ICD-10-CM | POA: Diagnosis not present

## 2019-12-09 DIAGNOSIS — I824Z3 Acute embolism and thrombosis of unspecified deep veins of distal lower extremity, bilateral: Secondary | ICD-10-CM

## 2019-12-09 DIAGNOSIS — I959 Hypotension, unspecified: Secondary | ICD-10-CM | POA: Diagnosis present

## 2019-12-09 DIAGNOSIS — N390 Urinary tract infection, site not specified: Secondary | ICD-10-CM | POA: Diagnosis present

## 2019-12-09 DIAGNOSIS — M1990 Unspecified osteoarthritis, unspecified site: Secondary | ICD-10-CM | POA: Diagnosis present

## 2019-12-09 DIAGNOSIS — I82432 Acute embolism and thrombosis of left popliteal vein: Secondary | ICD-10-CM | POA: Diagnosis not present

## 2019-12-09 DIAGNOSIS — K2100 Gastro-esophageal reflux disease with esophagitis, without bleeding: Secondary | ICD-10-CM | POA: Diagnosis not present

## 2019-12-09 DIAGNOSIS — E861 Hypovolemia: Secondary | ICD-10-CM | POA: Diagnosis present

## 2019-12-09 DIAGNOSIS — I110 Hypertensive heart disease with heart failure: Secondary | ICD-10-CM | POA: Diagnosis present

## 2019-12-09 DIAGNOSIS — I82412 Acute embolism and thrombosis of left femoral vein: Secondary | ICD-10-CM | POA: Diagnosis not present

## 2019-12-09 DIAGNOSIS — M7989 Other specified soft tissue disorders: Secondary | ICD-10-CM | POA: Diagnosis not present

## 2019-12-09 DIAGNOSIS — E875 Hyperkalemia: Secondary | ICD-10-CM | POA: Diagnosis present

## 2019-12-09 DIAGNOSIS — T501X5A Adverse effect of loop [high-ceiling] diuretics, initial encounter: Secondary | ICD-10-CM | POA: Diagnosis present

## 2019-12-09 DIAGNOSIS — M25571 Pain in right ankle and joints of right foot: Secondary | ICD-10-CM | POA: Diagnosis not present

## 2019-12-09 DIAGNOSIS — M25572 Pain in left ankle and joints of left foot: Secondary | ICD-10-CM | POA: Diagnosis not present

## 2019-12-09 DIAGNOSIS — R1310 Dysphagia, unspecified: Secondary | ICD-10-CM | POA: Diagnosis not present

## 2019-12-09 DIAGNOSIS — I82409 Acute embolism and thrombosis of unspecified deep veins of unspecified lower extremity: Secondary | ICD-10-CM | POA: Diagnosis not present

## 2019-12-09 DIAGNOSIS — R29898 Other symptoms and signs involving the musculoskeletal system: Secondary | ICD-10-CM | POA: Diagnosis not present

## 2019-12-09 DIAGNOSIS — K208 Other esophagitis without bleeding: Secondary | ICD-10-CM | POA: Diagnosis present

## 2019-12-09 DIAGNOSIS — N3289 Other specified disorders of bladder: Secondary | ICD-10-CM | POA: Diagnosis present

## 2019-12-09 DIAGNOSIS — R0600 Dyspnea, unspecified: Secondary | ICD-10-CM | POA: Diagnosis not present

## 2019-12-09 DIAGNOSIS — I5189 Other ill-defined heart diseases: Secondary | ICD-10-CM | POA: Diagnosis not present

## 2019-12-09 DIAGNOSIS — I5042 Chronic combined systolic (congestive) and diastolic (congestive) heart failure: Secondary | ICD-10-CM | POA: Diagnosis present

## 2019-12-09 DIAGNOSIS — R768 Other specified abnormal immunological findings in serum: Secondary | ICD-10-CM | POA: Diagnosis not present

## 2019-12-09 DIAGNOSIS — M2550 Pain in unspecified joint: Secondary | ICD-10-CM | POA: Diagnosis not present

## 2019-12-09 DIAGNOSIS — M795 Residual foreign body in soft tissue: Secondary | ICD-10-CM | POA: Diagnosis not present

## 2019-12-09 DIAGNOSIS — M069 Rheumatoid arthritis, unspecified: Secondary | ICD-10-CM | POA: Diagnosis present

## 2019-12-09 DIAGNOSIS — K640 First degree hemorrhoids: Secondary | ICD-10-CM | POA: Diagnosis not present

## 2019-12-09 DIAGNOSIS — Z90710 Acquired absence of both cervix and uterus: Secondary | ICD-10-CM | POA: Diagnosis not present

## 2019-12-09 DIAGNOSIS — K648 Other hemorrhoids: Secondary | ICD-10-CM | POA: Diagnosis present

## 2019-12-09 DIAGNOSIS — I872 Venous insufficiency (chronic) (peripheral): Secondary | ICD-10-CM | POA: Diagnosis present

## 2019-12-09 DIAGNOSIS — D509 Iron deficiency anemia, unspecified: Secondary | ICD-10-CM | POA: Diagnosis present

## 2019-12-09 DIAGNOSIS — I10 Essential (primary) hypertension: Secondary | ICD-10-CM | POA: Diagnosis not present

## 2019-12-09 DIAGNOSIS — J45909 Unspecified asthma, uncomplicated: Secondary | ICD-10-CM | POA: Diagnosis present

## 2019-12-09 HISTORY — DX: Acute embolism and thrombosis of unspecified deep veins of distal lower extremity, bilateral: I82.4Z3

## 2019-12-10 LAB — HM COLONOSCOPY

## 2019-12-14 DIAGNOSIS — I10 Essential (primary) hypertension: Secondary | ICD-10-CM | POA: Diagnosis not present

## 2019-12-16 ENCOUNTER — Telehealth: Payer: Self-pay | Admitting: Internal Medicine

## 2019-12-16 DIAGNOSIS — M5416 Radiculopathy, lumbar region: Secondary | ICD-10-CM | POA: Diagnosis not present

## 2019-12-16 DIAGNOSIS — M179 Osteoarthritis of knee, unspecified: Secondary | ICD-10-CM | POA: Diagnosis not present

## 2019-12-16 DIAGNOSIS — Z79891 Long term (current) use of opiate analgesic: Secondary | ICD-10-CM | POA: Diagnosis not present

## 2019-12-16 DIAGNOSIS — Z79899 Other long term (current) drug therapy: Secondary | ICD-10-CM | POA: Diagnosis not present

## 2019-12-16 DIAGNOSIS — M159 Polyosteoarthritis, unspecified: Secondary | ICD-10-CM | POA: Diagnosis not present

## 2019-12-16 DIAGNOSIS — F418 Other specified anxiety disorders: Secondary | ICD-10-CM | POA: Diagnosis not present

## 2019-12-16 DIAGNOSIS — M5126 Other intervertebral disc displacement, lumbar region: Secondary | ICD-10-CM | POA: Diagnosis not present

## 2019-12-16 DIAGNOSIS — M19019 Primary osteoarthritis, unspecified shoulder: Secondary | ICD-10-CM | POA: Diagnosis not present

## 2019-12-16 DIAGNOSIS — M47817 Spondylosis without myelopathy or radiculopathy, lumbosacral region: Secondary | ICD-10-CM | POA: Diagnosis not present

## 2019-12-16 DIAGNOSIS — M25519 Pain in unspecified shoulder: Secondary | ICD-10-CM | POA: Diagnosis not present

## 2019-12-16 DIAGNOSIS — G894 Chronic pain syndrome: Secondary | ICD-10-CM | POA: Diagnosis not present

## 2019-12-16 DIAGNOSIS — M791 Myalgia, unspecified site: Secondary | ICD-10-CM | POA: Diagnosis not present

## 2019-12-16 NOTE — Telephone Encounter (Signed)
Copied from CRM [id number]. Topic: General - Other >> Dec 16, 2019  8:47 AM Lyn Hollingshead D wrote: Reason for CRM: [facility] [phone] need and authorization from Dr Judithann Graves, they fax the request on 12/11/19

## 2019-12-16 NOTE — Telephone Encounter (Signed)
Could not find the request. Called and requested that they refax this so I can get Dr B to sign and I will fax back. Awaiting FAX.  CM

## 2019-12-17 ENCOUNTER — Inpatient Hospital Stay: Payer: Medicare Other | Admitting: Internal Medicine

## 2019-12-18 ENCOUNTER — Other Ambulatory Visit: Payer: Self-pay | Admitting: Internal Medicine

## 2019-12-18 ENCOUNTER — Ambulatory Visit (INDEPENDENT_AMBULATORY_CARE_PROVIDER_SITE_OTHER): Payer: Medicare Other | Admitting: Internal Medicine

## 2019-12-18 ENCOUNTER — Encounter: Payer: Self-pay | Admitting: Internal Medicine

## 2019-12-18 ENCOUNTER — Other Ambulatory Visit: Payer: Self-pay

## 2019-12-18 VITALS — BP 142/48 | HR 86 | Temp 97.9°F | Ht 64.0 in | Wt 204.0 lb

## 2019-12-18 DIAGNOSIS — N179 Acute kidney failure, unspecified: Secondary | ICD-10-CM | POA: Diagnosis not present

## 2019-12-18 DIAGNOSIS — D509 Iron deficiency anemia, unspecified: Secondary | ICD-10-CM | POA: Insufficient documentation

## 2019-12-18 DIAGNOSIS — M159 Polyosteoarthritis, unspecified: Secondary | ICD-10-CM | POA: Diagnosis not present

## 2019-12-18 DIAGNOSIS — I824Z3 Acute embolism and thrombosis of unspecified deep veins of distal lower extremity, bilateral: Secondary | ICD-10-CM | POA: Diagnosis not present

## 2019-12-18 DIAGNOSIS — N3000 Acute cystitis without hematuria: Secondary | ICD-10-CM

## 2019-12-18 DIAGNOSIS — L03119 Cellulitis of unspecified part of limb: Secondary | ICD-10-CM

## 2019-12-18 DIAGNOSIS — I82409 Acute embolism and thrombosis of unspecified deep veins of unspecified lower extremity: Secondary | ICD-10-CM | POA: Diagnosis not present

## 2019-12-18 DIAGNOSIS — M069 Rheumatoid arthritis, unspecified: Secondary | ICD-10-CM | POA: Diagnosis not present

## 2019-12-18 MED ORDER — SULFAMETHOXAZOLE-TRIMETHOPRIM 800-160 MG PO TABS: 1.0000 | ORAL_TABLET | Freq: Two times a day (BID) | ORAL | 0 refills | Status: AC

## 2019-12-18 NOTE — Patient Instructions (Addendum)
Lasix - take a half pill daily and keep feet as elevated as possible  Take Bactrim twice a day for 10 days.  Continue iron supplement daily.

## 2019-12-18 NOTE — Telephone Encounter (Signed)
Called and left VM for verbal orders for pt.   CM

## 2019-12-18 NOTE — Progress Notes (Signed)
Date:  12/18/2019   Name:  Hayley Ewing   DOB:  May 16, 1946   MRN:  [id number]   Chief Complaint: No chief complaint on file. Hospital follow up from [facility]. Admitted  12/08/19 to 12/11/19.  She received a TOC call yesterday.  Admission Diagnoses:  Weakness of both lower extremities [R29.898] Dyspnea, unspecified type [R06.00] SOB (shortness of breath) [R06.02]  Discharge Diagnoses:  Principal Problem: Acute deep vein thrombosis (DVT) of distal vein of both lower extremities (CMS-HCC) Active Problems: Venous insufficiency of both lower extremities Diastolic dysfunction Essential hypertension Weakness of both lower extremities Polyarthralgia, unspecified Rheumatoid factor positive AKI (acute kidney injury) (CMS-HCC) Microcytic anemia Resolved Problems: * No resolved hospital problems. * Primary Diagnosis: Admitted for lower extremity edema & AKI & found to have bilateral lower extremity DVTs.  Changes Made: -Started on apixaban 10mg  PO BID x 7 days then 5mg  PO BID x at least 3 months. -Stopped Furosemide. -Started oral ferrous sulfate 325mg  daily. -Oxybutynin PRN prescribed for bladder spasms.   Anticipatory Guidance for Outpatient Provider:  -Will need to re-assess for duration of anticoagulation as these were un-provoked distal lower extremity DVTs--some could argue for life-long anticoagulation with reassessment every 48-months.  Recommended Follow-up Studies:  -CBC -RFP -She will need outpatient colonoscopy as her prep was very poor for the inpatient colonoscopy. (Colonoscopy and EGD done by at [facility]  Hgb = 7.6 GFR = 55  Anemia Presents for follow-up visit. There has been no confusion, fever, light-headedness, palpitations or weight loss. There are no compliance problems.    Bilateral DVT - started on Eliquis 5 mg bid.  Lung scan was low prob for PE.  No bleeding except minor nose bleeding.  Edema and redness - in both LEs with  non healing ulcer on right ankle.  She has been trying to elevate but sleeps on the sofa and does not have a recliner handy.  She thinks that she is more swollen since stopping the furosemide.  Lab Results  Component Value Date   CREATININE 0.88 10/22/2019   BUN 16 10/22/2019   NA 139 10/22/2019   K 4.6 10/22/2019   CL 103 10/22/2019   CO2 25 10/22/2019   Lab Results  Component Value Date   CHOL 218 (H) 10/22/2019   HDL 92 10/22/2019   LDLCALC 105 (H) 10/22/2019   TRIG 124 10/22/2019   CHOLHDL 2.4 10/22/2019   Lab Results  Component Value Date   TSH 0.448 (L) 10/22/2019   Lab Results  Component Value Date   HGBA1C 5.6 10/22/2019   Lab Results  Component Value Date   WBC 4.7 11/01/2016   HGB 10.8 (L) 11/01/2016   HCT 34.5 11/01/2016   MCV 74 (L) 11/01/2016   PLT 274 11/01/2016   Lab Results  Component Value Date   ALT 22 10/22/2019   AST 11 10/22/2019   ALKPHOS 58 10/22/2019   BILITOT 0.5 10/22/2019     Review of Systems  Constitutional: Negative for chills, fatigue, fever and weight loss.  Respiratory: Negative for cough, chest tightness, shortness of breath and wheezing.   Cardiovascular: Positive for leg swelling. Negative for chest pain and palpitations.  Musculoskeletal: Positive for arthralgias and gait problem.  Neurological: Negative for dizziness, light-headedness and headaches.  Psychiatric/Behavioral: Negative for confusion.    Patient Active Problem List   Diagnosis Date Noted  . Acute deep vein thrombosis (DVT) of distal vein of both lower extremities (HCC) 12/09/2019  . Current chronic  use of systemic steroids 11/15/2019  . Generalized osteoarthritis 10/22/2019  . Heart failure with reduced ejection fraction ([place]) 09/16/2019  . Chondrocalcinosis 01/15/2018  . Rheumatoid arthritis ([place]) 01/15/2018  . Colitis 08/31/2017  . Essential hypertension 08/10/2017  . Asthma, persistent controlled 07/10/2017  . Displacement of lumbar intervertebral  disc without myelopathy 01/30/2017  . Venous insufficiency of both lower extremities 06/14/2016  . Bladder cystocele 05/19/2015  . S/P hip replacement, right 05/19/2015    No Known Allergies  Past Surgical History:  Procedure Laterality Date  . TOTAL ABDOMINAL HYSTERECTOMY  1998  . TOTAL HIP ARTHROPLASTY Right 2014    Social History   Tobacco Use  . Smoking status: Never Smoker  . Smokeless tobacco: Never Used  . Tobacco comment: smoking cessation materials not required  Substance Use Topics  . Alcohol use: Not Currently  . Drug use: No     Medication list has been reviewed and updated.  No outpatient medications have been marked as taking for the 12/18/19 encounter (Appointment) with Glean Hess, MD.   Current Facility-Administered Medications for the 12/18/19 encounter (Appointment) with Glean Hess, MD  Medication  . albuterol (PROVENTIL) (2.5 MG/3ML) 0.083% nebulizer solution 2.5 mg    PHQ 2/9 Scores 11/18/2019 11/15/2019 10/22/2019 10/04/2019  PHQ - 2 Score 2 2 0 0  PHQ- 9 Score 2 2 6  -    BP Readings from Last 3 Encounters:  11/15/19 102/78  10/22/19 130/90  08/21/18 120/68    Physical Exam Constitutional:      Appearance: She is obese.  Cardiovascular:     Rate and Rhythm: Normal rate and regular rhythm.  Pulmonary:     Effort: Pulmonary effort is normal.     Breath sounds: Normal breath sounds. No wheezing or rhonchi.  Musculoskeletal:        General: Swelling and tenderness present.     Cervical back: Normal range of motion.     Right lower leg: Edema present.     Left lower leg: Edema present.  Lymphadenopathy:     Cervical: No cervical adenopathy.  Skin:    General: Skin is warm.     Findings: Erythema and lesion (ulcer on right ankle) present.  Neurological:     General: No focal deficit present.     Mental Status: She is alert and oriented to person, place, and time.     Wt Readings from Last 3 Encounters:  11/15/19 183 lb (83  kg)  10/22/19 184 lb (83.5 kg)  08/21/18 184 lb (83.5 kg)    There were no vitals taken for this visit.  Assessment and Plan: 1. Acute deep vein thrombosis (DVT) of distal vein of both lower extremities (HCC) Continue Eliquis bid for at least 3 months Elevate as much as possible and resume Lasix 10 mg per day  2. Iron deficiency anemia, unspecified iron deficiency anemia type Continue iron supplement - CBC with Differential/Platelet  3. AKI (acute kidney injury) ([place]) Recheck labs - Basic metabolic panel  4. Acute cystitis without hematuria Pt unable to void Citrobacter was sensitive to Cipro and Bactrim Will use bactrim for both urine and LE cellulitis  5. Cellulitis of lower extremity, unspecified laterality - sulfamethoxazole-trimethoprim (BACTRIM DS) 800-160 MG tablet; Take 1 tablet by mouth 2 (two) times daily for 10 days.  Dispense: 20 tablet; Refill: 0   Partially dictated using Editor, commissioning. Any errors are unintentional.  Halina Maidens, MD [facility]  12/18/2019

## 2019-12-18 NOTE — Telephone Encounter (Signed)
Heather called from [facility] regarding fax/orders. Herbert Seta states she needs just a verbal order for patient for PT 1x 2 weeks 2x 2weeks  1x 2 weeks Call back [phone]

## 2019-12-19 ENCOUNTER — Telehealth: Payer: Self-pay | Admitting: Internal Medicine

## 2019-12-19 LAB — CBC WITH DIFFERENTIAL/PLATELET
Basophils Absolute: 0.1 10*3/uL (ref 0.0–0.2)
Basos: 1 %
EOS (ABSOLUTE): 0.2 10*3/uL (ref 0.0–0.4)
Eos: 3 %
Hematocrit: 25.8 % — ABNORMAL LOW (ref 34.0–46.6)
Hemoglobin: 7.8 g/dL — ABNORMAL LOW (ref 11.1–15.9)
Immature Grans (Abs): 0.1 10*3/uL (ref 0.0–0.1)
Immature Granulocytes: 1 %
Lymphocytes Absolute: 2.5 10*3/uL (ref 0.7–3.1)
Lymphs: 39 %
MCH: 23.8 pg — ABNORMAL LOW (ref 26.6–33.0)
MCHC: 30.2 g/dL — ABNORMAL LOW (ref 31.5–35.7)
MCV: 79 fL (ref 79–97)
Monocytes Absolute: 1 10*3/uL — ABNORMAL HIGH (ref 0.1–0.9)
Monocytes: 16 %
Neutrophils Absolute: 2.6 10*3/uL (ref 1.4–7.0)
Neutrophils: 40 %
Platelets: 351 10*3/uL (ref 150–450)
RBC: 3.28 x10E6/uL — ABNORMAL LOW (ref 3.77–5.28)
RDW: 21 % — ABNORMAL HIGH (ref 11.7–15.4)
WBC: 6.5 10*3/uL (ref 3.4–10.8)

## 2019-12-19 LAB — BASIC METABOLIC PANEL
BUN/Creatinine Ratio: 23 (ref 12–28)
BUN: 29 mg/dL — ABNORMAL HIGH (ref 8–27)
CO2: 21 mmol/L (ref 20–29)
Calcium: 9 mg/dL (ref 8.7–10.3)
Chloride: 109 mmol/L — ABNORMAL HIGH (ref 96–106)
Creatinine, Ser: 1.27 mg/dL — ABNORMAL HIGH (ref 0.57–1.00)
GFR calc Af Amer: 48 mL/min/{1.73_m2} — ABNORMAL LOW (ref >59)
GFR calc non Af Amer: 42 mL/min/{1.73_m2} — ABNORMAL LOW (ref >59)
Glucose: 82 mg/dL (ref 65–99)
Potassium: 5.9 mmol/L — ABNORMAL HIGH (ref 3.5–5.2)
Sodium: 142 mmol/L (ref 134–144)

## 2019-12-19 NOTE — Telephone Encounter (Signed)
Home Health Verbal Orders - Caller/Agency: Elin/ [facility] Callback Number: [phone] Requesting OT/PT/Skilled Nursing/Social Work/Speech Therapy: OT Frequency: 1x 4 weeks

## 2019-12-20 NOTE — Telephone Encounter (Signed)
Called and gave verbal orders to Home Health.   CM

## 2019-12-31 ENCOUNTER — Telehealth: Payer: Self-pay | Admitting: Internal Medicine

## 2019-12-31 NOTE — Telephone Encounter (Signed)
Husband calling this am, on his way to work.  He states he feels like the edema in pts legs is getting worse.  Pt just seen 5/26 for hospital fup. He states one of her legs is starting to burst, and fluid coming out. He states he has had triple bypass, and cannot pick her up.  Pt can hardly walk. He feels like pt really needs some help and he is unable to assist.   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 (There is an order for home health services from 12/19/19.  Husband must not be aware of this.) He states pt is sleeping right now, finally getting some rest.  He would like a call back before 11 am, when the restaurant will open for business.  I am unable to locate a DPR.

## 2019-12-31 NOTE — Telephone Encounter (Signed)
Please Advise.  KP

## 2019-12-31 NOTE — Telephone Encounter (Signed)
There is nothing that I can do acutely and I can not talk to her husband.  She will need a referral to Vascular surgery if she is not already established.

## 2019-12-31 NOTE — Telephone Encounter (Signed)
Called to inform pt of Dr Karn Cassis note- Pt VM is full and cannot leave a message. Need to try again in the morning. Pt does not need to come here for her visit Friday- she needs to see vascular surgery.   A referral can be placed if not established.  CM

## 2020-01-01 ENCOUNTER — Other Ambulatory Visit: Payer: Self-pay

## 2020-01-01 ENCOUNTER — Telehealth: Payer: Self-pay | Admitting: Internal Medicine

## 2020-01-01 DIAGNOSIS — L03119 Cellulitis of unspecified part of limb: Secondary | ICD-10-CM

## 2020-01-01 DIAGNOSIS — I824Z3 Acute embolism and thrombosis of unspecified deep veins of distal lower extremity, bilateral: Secondary | ICD-10-CM

## 2020-01-01 NOTE — Telephone Encounter (Signed)
Tried to call pt again. VM was full could not leave message.  KP

## 2020-01-01 NOTE — Telephone Encounter (Signed)
Pts husband called regarding the pts referral for vein and vascular and is requesting to be seen in [place] instead of [place]. Please advise .

## 2020-01-01 NOTE — Telephone Encounter (Signed)
Copied from CRM #[id number]. Topic: General - Inquiry >> Jan 01, 2020  2:21 PM Bonney, Mary Lauren wrote: Reason for CRM: Diane PT from [facility] is requesting a call back from PCP nurse regarding in patient leg swelling.  Patient is having pain from the swelling.  Patient is on blood thinner for two weeks but there is no change.   Call back [phone] 

## 2020-01-01 NOTE — Telephone Encounter (Signed)
Called [facility] back. She explain to me about pts pain from swelling.Told her that we was trying to get in touch with Darl Pikes. She told me to call her husband Brett Canales.  Called husband  told him we needed to cancel appt that she needs to go to vascular surgery. Husband confirmed that we could cancel her fridays appt 6/11. Also said it was ok to put in a referral. Chassidy placed referral.  KP

## 2020-01-01 NOTE — Telephone Encounter (Signed)
Noted. Referral placed.  KP

## 2020-01-01 NOTE — Telephone Encounter (Unsigned)
Copied from CRM [id number]. Topic: General - Inquiry >> Jan 01, 2020  2:21 PM Deborha Payment wrote: Reason for CRM: Diane PT from [facility] is requesting a call back from PCP nurse regarding in patient leg swelling.  Patient is having pain from the swelling.  Patient is on blood thinner for two weeks but there is no change.   Call back [phone]

## 2020-01-02 ENCOUNTER — Telehealth: Payer: Self-pay | Admitting: Internal Medicine

## 2020-01-02 DIAGNOSIS — I824Z3 Acute embolism and thrombosis of unspecified deep veins of distal lower extremity, bilateral: Secondary | ICD-10-CM | POA: Diagnosis not present

## 2020-01-02 DIAGNOSIS — N179 Acute kidney failure, unspecified: Secondary | ICD-10-CM | POA: Diagnosis not present

## 2020-01-02 DIAGNOSIS — I5030 Unspecified diastolic (congestive) heart failure: Secondary | ICD-10-CM | POA: Diagnosis not present

## 2020-01-02 DIAGNOSIS — I5020 Unspecified systolic (congestive) heart failure: Secondary | ICD-10-CM | POA: Diagnosis not present

## 2020-01-02 DIAGNOSIS — I5033 Acute on chronic diastolic (congestive) heart failure: Secondary | ICD-10-CM | POA: Diagnosis not present

## 2020-01-02 DIAGNOSIS — D500 Iron deficiency anemia secondary to blood loss (chronic): Secondary | ICD-10-CM | POA: Diagnosis not present

## 2020-01-02 NOTE — Telephone Encounter (Signed)
Copied from CRM [id number]. Topic: General - Other >> Jan 02, 2020  3:24 PM Reggie Pile, [place] wrote: Reason for CRM: [facility] called in regards to records request they received for patient. Patient last seen in their office in 2012. Unfortunately the vein center purges records every 7 years, so they no longer have records of patient due to it being so long. Please advise.

## 2020-01-03 ENCOUNTER — Telehealth: Payer: Self-pay | Admitting: Internal Medicine

## 2020-01-03 ENCOUNTER — Ambulatory Visit: Payer: Medicare Other | Admitting: Internal Medicine

## 2020-01-03 DIAGNOSIS — J9811 Atelectasis: Secondary | ICD-10-CM | POA: Diagnosis not present

## 2020-01-03 DIAGNOSIS — I5020 Unspecified systolic (congestive) heart failure: Secondary | ICD-10-CM | POA: Diagnosis not present

## 2020-01-03 DIAGNOSIS — N200 Calculus of kidney: Secondary | ICD-10-CM | POA: Diagnosis not present

## 2020-01-03 DIAGNOSIS — R0600 Dyspnea, unspecified: Secondary | ICD-10-CM | POA: Diagnosis not present

## 2020-01-03 DIAGNOSIS — I82541 Chronic embolism and thrombosis of right tibial vein: Secondary | ICD-10-CM | POA: Diagnosis not present

## 2020-01-03 DIAGNOSIS — R600 Localized edema: Secondary | ICD-10-CM | POA: Diagnosis not present

## 2020-01-03 NOTE — Telephone Encounter (Signed)
Called and let Irving Burton know this is noted in patients chart.  CM

## 2020-01-03 NOTE — Telephone Encounter (Signed)
Copied from CRM [id number]. Topic: General - Inquiry >> Jan 02, 2020  6:11 PM Leary Roca wrote: Reason for CRM: Irving Burton from [place] at [facility] .states pt missed appt [phone] at 11:30am . Please advise    Call back [phone]

## 2020-01-03 NOTE — Telephone Encounter (Signed)
Patient said she did not need any records right now due to her being in the hospital. Also I left a message to [facility].

## 2020-01-04 DIAGNOSIS — I5020 Unspecified systolic (congestive) heart failure: Secondary | ICD-10-CM | POA: Diagnosis not present

## 2020-01-04 DIAGNOSIS — I824Z3 Acute embolism and thrombosis of unspecified deep veins of distal lower extremity, bilateral: Secondary | ICD-10-CM | POA: Diagnosis not present

## 2020-01-04 DIAGNOSIS — N179 Acute kidney failure, unspecified: Secondary | ICD-10-CM | POA: Diagnosis not present

## 2020-01-04 DIAGNOSIS — D500 Iron deficiency anemia secondary to blood loss (chronic): Secondary | ICD-10-CM | POA: Diagnosis not present

## 2020-01-04 DIAGNOSIS — I5033 Acute on chronic diastolic (congestive) heart failure: Secondary | ICD-10-CM | POA: Diagnosis not present

## 2020-01-05 DIAGNOSIS — I5020 Unspecified systolic (congestive) heart failure: Secondary | ICD-10-CM | POA: Diagnosis not present

## 2020-01-05 DIAGNOSIS — I824Z3 Acute embolism and thrombosis of unspecified deep veins of distal lower extremity, bilateral: Secondary | ICD-10-CM | POA: Diagnosis not present

## 2020-01-05 DIAGNOSIS — D500 Iron deficiency anemia secondary to blood loss (chronic): Secondary | ICD-10-CM | POA: Diagnosis not present

## 2020-01-05 DIAGNOSIS — I5033 Acute on chronic diastolic (congestive) heart failure: Secondary | ICD-10-CM | POA: Diagnosis not present

## 2020-01-05 DIAGNOSIS — N179 Acute kidney failure, unspecified: Secondary | ICD-10-CM | POA: Diagnosis not present

## 2020-01-06 DIAGNOSIS — I5033 Acute on chronic diastolic (congestive) heart failure: Secondary | ICD-10-CM | POA: Diagnosis not present

## 2020-01-06 DIAGNOSIS — I824Z3 Acute embolism and thrombosis of unspecified deep veins of distal lower extremity, bilateral: Secondary | ICD-10-CM | POA: Diagnosis not present

## 2020-01-06 DIAGNOSIS — N179 Acute kidney failure, unspecified: Secondary | ICD-10-CM | POA: Diagnosis not present

## 2020-01-06 DIAGNOSIS — I110 Hypertensive heart disease with heart failure: Secondary | ICD-10-CM | POA: Diagnosis not present

## 2020-01-06 DIAGNOSIS — D509 Iron deficiency anemia, unspecified: Secondary | ICD-10-CM | POA: Diagnosis not present

## 2020-01-07 DIAGNOSIS — N179 Acute kidney failure, unspecified: Secondary | ICD-10-CM | POA: Diagnosis not present

## 2020-01-07 DIAGNOSIS — I5033 Acute on chronic diastolic (congestive) heart failure: Secondary | ICD-10-CM | POA: Diagnosis not present

## 2020-01-07 DIAGNOSIS — D509 Iron deficiency anemia, unspecified: Secondary | ICD-10-CM | POA: Diagnosis not present

## 2020-01-07 DIAGNOSIS — I824Z3 Acute embolism and thrombosis of unspecified deep veins of distal lower extremity, bilateral: Secondary | ICD-10-CM | POA: Diagnosis not present

## 2020-01-07 DIAGNOSIS — I110 Hypertensive heart disease with heart failure: Secondary | ICD-10-CM | POA: Diagnosis not present

## 2020-01-08 DIAGNOSIS — N179 Acute kidney failure, unspecified: Secondary | ICD-10-CM | POA: Diagnosis not present

## 2020-01-08 DIAGNOSIS — D509 Iron deficiency anemia, unspecified: Secondary | ICD-10-CM | POA: Diagnosis not present

## 2020-01-08 DIAGNOSIS — I824Z3 Acute embolism and thrombosis of unspecified deep veins of distal lower extremity, bilateral: Secondary | ICD-10-CM | POA: Diagnosis not present

## 2020-01-08 DIAGNOSIS — I110 Hypertensive heart disease with heart failure: Secondary | ICD-10-CM | POA: Diagnosis not present

## 2020-01-08 DIAGNOSIS — I5033 Acute on chronic diastolic (congestive) heart failure: Secondary | ICD-10-CM | POA: Diagnosis not present

## 2020-01-13 DIAGNOSIS — I82441 Acute embolism and thrombosis of right tibial vein: Secondary | ICD-10-CM | POA: Diagnosis not present

## 2020-01-13 DIAGNOSIS — I872 Venous insufficiency (chronic) (peripheral): Secondary | ICD-10-CM | POA: Diagnosis not present

## 2020-01-13 DIAGNOSIS — Z96641 Presence of right artificial hip joint: Secondary | ICD-10-CM | POA: Diagnosis not present

## 2020-01-13 DIAGNOSIS — Z7901 Long term (current) use of anticoagulants: Secondary | ICD-10-CM | POA: Diagnosis not present

## 2020-01-13 DIAGNOSIS — N390 Urinary tract infection, site not specified: Secondary | ICD-10-CM | POA: Diagnosis not present

## 2020-01-13 DIAGNOSIS — I110 Hypertensive heart disease with heart failure: Secondary | ICD-10-CM | POA: Diagnosis not present

## 2020-01-13 DIAGNOSIS — I5020 Unspecified systolic (congestive) heart failure: Secondary | ICD-10-CM | POA: Diagnosis not present

## 2020-01-13 DIAGNOSIS — M059 Rheumatoid arthritis with rheumatoid factor, unspecified: Secondary | ICD-10-CM | POA: Diagnosis not present

## 2020-01-13 DIAGNOSIS — B9689 Other specified bacterial agents as the cause of diseases classified elsewhere: Secondary | ICD-10-CM | POA: Diagnosis not present

## 2020-01-13 DIAGNOSIS — D509 Iron deficiency anemia, unspecified: Secondary | ICD-10-CM | POA: Diagnosis not present

## 2020-01-13 DIAGNOSIS — Z90710 Acquired absence of both cervix and uterus: Secondary | ICD-10-CM | POA: Diagnosis not present

## 2020-01-13 DIAGNOSIS — I5033 Acute on chronic diastolic (congestive) heart failure: Secondary | ICD-10-CM | POA: Diagnosis not present

## 2020-01-13 DIAGNOSIS — M1991 Primary osteoarthritis, unspecified site: Secondary | ICD-10-CM | POA: Diagnosis not present

## 2020-01-13 DIAGNOSIS — I82432 Acute embolism and thrombosis of left popliteal vein: Secondary | ICD-10-CM | POA: Diagnosis not present

## 2020-01-13 DIAGNOSIS — J45909 Unspecified asthma, uncomplicated: Secondary | ICD-10-CM | POA: Diagnosis not present

## 2020-01-15 DIAGNOSIS — I110 Hypertensive heart disease with heart failure: Secondary | ICD-10-CM | POA: Diagnosis not present

## 2020-01-15 DIAGNOSIS — I872 Venous insufficiency (chronic) (peripheral): Secondary | ICD-10-CM | POA: Diagnosis not present

## 2020-01-15 DIAGNOSIS — I82432 Acute embolism and thrombosis of left popliteal vein: Secondary | ICD-10-CM | POA: Diagnosis not present

## 2020-01-15 DIAGNOSIS — I5033 Acute on chronic diastolic (congestive) heart failure: Secondary | ICD-10-CM | POA: Diagnosis not present

## 2020-01-15 DIAGNOSIS — I82441 Acute embolism and thrombosis of right tibial vein: Secondary | ICD-10-CM | POA: Diagnosis not present

## 2020-01-15 DIAGNOSIS — I5020 Unspecified systolic (congestive) heart failure: Secondary | ICD-10-CM | POA: Diagnosis not present

## 2020-01-17 ENCOUNTER — Other Ambulatory Visit: Payer: Self-pay

## 2020-01-17 ENCOUNTER — Telehealth: Payer: Self-pay | Admitting: Internal Medicine

## 2020-01-17 NOTE — Telephone Encounter (Signed)
Called and spoke with Elin. Gave verbal orders for patient.   CM

## 2020-01-17 NOTE — Telephone Encounter (Signed)
Elin OT with [facility] is calling to requesting verbal order  extension OT 1x4

## 2020-01-17 NOTE — Telephone Encounter (Signed)
Called left VM giving verbal orders.  KP 

## 2020-01-17 NOTE — Telephone Encounter (Unsigned)
Copied from CRM [id number]. Topic: General - Other >> Jan 17, 2020 10:50 AM Elliot Gault wrote: Caller name: [place] Relation to pt: RN from [facility]  Call back number: [phone]    Reason for call: Nurse requesting verbal orders to visit patient at home next week  for 1x 1

## 2020-01-17 NOTE — Telephone Encounter (Signed)
Called left VM to call back.  KP

## 2020-01-20 ENCOUNTER — Ambulatory Visit: Payer: Self-pay | Admitting: Internal Medicine

## 2020-01-20 DIAGNOSIS — I872 Venous insufficiency (chronic) (peripheral): Secondary | ICD-10-CM | POA: Diagnosis not present

## 2020-01-20 DIAGNOSIS — I110 Hypertensive heart disease with heart failure: Secondary | ICD-10-CM | POA: Diagnosis not present

## 2020-01-20 DIAGNOSIS — I5020 Unspecified systolic (congestive) heart failure: Secondary | ICD-10-CM | POA: Diagnosis not present

## 2020-01-20 DIAGNOSIS — I82441 Acute embolism and thrombosis of right tibial vein: Secondary | ICD-10-CM | POA: Diagnosis not present

## 2020-01-20 DIAGNOSIS — I5033 Acute on chronic diastolic (congestive) heart failure: Secondary | ICD-10-CM | POA: Diagnosis not present

## 2020-01-20 DIAGNOSIS — I82432 Acute embolism and thrombosis of left popliteal vein: Secondary | ICD-10-CM | POA: Diagnosis not present

## 2020-01-22 ENCOUNTER — Ambulatory Visit: Payer: Medicare Other | Admitting: Internal Medicine

## 2020-01-22 DIAGNOSIS — I110 Hypertensive heart disease with heart failure: Secondary | ICD-10-CM | POA: Diagnosis not present

## 2020-01-22 DIAGNOSIS — I82432 Acute embolism and thrombosis of left popliteal vein: Secondary | ICD-10-CM | POA: Diagnosis not present

## 2020-01-22 DIAGNOSIS — I5033 Acute on chronic diastolic (congestive) heart failure: Secondary | ICD-10-CM | POA: Diagnosis not present

## 2020-01-22 DIAGNOSIS — I5020 Unspecified systolic (congestive) heart failure: Secondary | ICD-10-CM | POA: Diagnosis not present

## 2020-01-22 DIAGNOSIS — I872 Venous insufficiency (chronic) (peripheral): Secondary | ICD-10-CM | POA: Diagnosis not present

## 2020-01-22 DIAGNOSIS — I82441 Acute embolism and thrombosis of right tibial vein: Secondary | ICD-10-CM | POA: Diagnosis not present

## 2020-01-23 ENCOUNTER — Other Ambulatory Visit: Payer: Self-pay | Admitting: Internal Medicine

## 2020-01-23 DIAGNOSIS — N3000 Acute cystitis without hematuria: Secondary | ICD-10-CM

## 2020-01-23 DIAGNOSIS — D509 Iron deficiency anemia, unspecified: Secondary | ICD-10-CM

## 2020-01-23 DIAGNOSIS — I824Z3 Acute embolism and thrombosis of unspecified deep veins of distal lower extremity, bilateral: Secondary | ICD-10-CM

## 2020-01-23 DIAGNOSIS — Z96641 Presence of right artificial hip joint: Secondary | ICD-10-CM

## 2020-01-23 DIAGNOSIS — I10 Essential (primary) hypertension: Secondary | ICD-10-CM

## 2020-01-23 DIAGNOSIS — I872 Venous insufficiency (chronic) (peripheral): Secondary | ICD-10-CM

## 2020-01-23 DIAGNOSIS — J45998 Other asthma: Secondary | ICD-10-CM

## 2020-01-23 DIAGNOSIS — I5032 Chronic diastolic (congestive) heart failure: Secondary | ICD-10-CM

## 2020-01-23 DIAGNOSIS — Z7901 Long term (current) use of anticoagulants: Secondary | ICD-10-CM

## 2020-01-23 DIAGNOSIS — M0579 Rheumatoid arthritis with rheumatoid factor of multiple sites without organ or systems involvement: Secondary | ICD-10-CM

## 2020-01-23 NOTE — Progress Notes (Signed)
Received orders from [place] [facility]. Start of care 12/14/19.   Orders from 12/14/19 through 02/11/20 are reviewed, signed and faxed.

## 2020-01-24 ENCOUNTER — Telehealth: Payer: Self-pay | Admitting: Internal Medicine

## 2020-01-24 DIAGNOSIS — I110 Hypertensive heart disease with heart failure: Secondary | ICD-10-CM | POA: Diagnosis not present

## 2020-01-24 DIAGNOSIS — I872 Venous insufficiency (chronic) (peripheral): Secondary | ICD-10-CM | POA: Diagnosis not present

## 2020-01-24 DIAGNOSIS — I82432 Acute embolism and thrombosis of left popliteal vein: Secondary | ICD-10-CM | POA: Diagnosis not present

## 2020-01-24 DIAGNOSIS — I5033 Acute on chronic diastolic (congestive) heart failure: Secondary | ICD-10-CM | POA: Diagnosis not present

## 2020-01-24 DIAGNOSIS — I5020 Unspecified systolic (congestive) heart failure: Secondary | ICD-10-CM | POA: Diagnosis not present

## 2020-01-24 DIAGNOSIS — I82441 Acute embolism and thrombosis of right tibial vein: Secondary | ICD-10-CM | POA: Diagnosis not present

## 2020-01-24 DIAGNOSIS — Z79899 Other long term (current) drug therapy: Secondary | ICD-10-CM | POA: Diagnosis not present

## 2020-01-24 NOTE — Telephone Encounter (Signed)
Copied from CRM [id number]. Topic: Quick Communication - Home Health Verbal Orders >> Jan 24, 2020 10:24 AM Reuben Likes D wrote: Caller/Agency: [place]/[facility] Callback Number: [phone] Requesting OT/PT/Skilled Nursing/Social Work/Speech Therapy: PT Frequency:  once four times a week

## 2020-01-27 DIAGNOSIS — I110 Hypertensive heart disease with heart failure: Secondary | ICD-10-CM | POA: Diagnosis not present

## 2020-01-27 DIAGNOSIS — I872 Venous insufficiency (chronic) (peripheral): Secondary | ICD-10-CM | POA: Diagnosis not present

## 2020-01-27 DIAGNOSIS — I5033 Acute on chronic diastolic (congestive) heart failure: Secondary | ICD-10-CM | POA: Diagnosis not present

## 2020-01-27 DIAGNOSIS — I5020 Unspecified systolic (congestive) heart failure: Secondary | ICD-10-CM | POA: Diagnosis not present

## 2020-01-27 DIAGNOSIS — I82432 Acute embolism and thrombosis of left popliteal vein: Secondary | ICD-10-CM | POA: Diagnosis not present

## 2020-01-27 DIAGNOSIS — I82441 Acute embolism and thrombosis of right tibial vein: Secondary | ICD-10-CM | POA: Diagnosis not present

## 2020-01-29 DIAGNOSIS — I82441 Acute embolism and thrombosis of right tibial vein: Secondary | ICD-10-CM | POA: Diagnosis not present

## 2020-01-29 DIAGNOSIS — I5020 Unspecified systolic (congestive) heart failure: Secondary | ICD-10-CM | POA: Diagnosis not present

## 2020-01-29 DIAGNOSIS — I872 Venous insufficiency (chronic) (peripheral): Secondary | ICD-10-CM | POA: Diagnosis not present

## 2020-01-29 DIAGNOSIS — I82432 Acute embolism and thrombosis of left popliteal vein: Secondary | ICD-10-CM | POA: Diagnosis not present

## 2020-01-29 DIAGNOSIS — I110 Hypertensive heart disease with heart failure: Secondary | ICD-10-CM | POA: Diagnosis not present

## 2020-01-29 DIAGNOSIS — I5033 Acute on chronic diastolic (congestive) heart failure: Secondary | ICD-10-CM | POA: Diagnosis not present

## 2020-01-29 LAB — BASIC METABOLIC PANEL
BUN: 21 (ref 4–21)
CO2: 25 — AB (ref 13–22)
Chloride: 105 (ref 99–108)
Creatinine: 1.1 (ref 0.5–1.1)
Potassium: 5 (ref 3.4–5.3)
Sodium: 140 (ref 137–147)

## 2020-01-29 LAB — COMPREHENSIVE METABOLIC PANEL
Calcium: 10.1 (ref 8.7–10.7)
GFR calc non Af Amer: 49

## 2020-01-29 LAB — CBC AND DIFFERENTIAL
HCT: 40 (ref 36–46)
Hemoglobin: 12 (ref 12.0–16.0)
Platelets: 265 (ref 150–399)
WBC: 7.5

## 2020-01-31 ENCOUNTER — Ambulatory Visit (INDEPENDENT_AMBULATORY_CARE_PROVIDER_SITE_OTHER): Payer: Medicare Other | Admitting: Internal Medicine

## 2020-01-31 ENCOUNTER — Encounter: Payer: Self-pay | Admitting: Internal Medicine

## 2020-01-31 ENCOUNTER — Other Ambulatory Visit: Payer: Self-pay

## 2020-01-31 VITALS — BP 80/50 | HR 72 | Temp 98.2°F | Ht 64.0 in | Wt 178.0 lb

## 2020-01-31 DIAGNOSIS — I824Z3 Acute embolism and thrombosis of unspecified deep veins of distal lower extremity, bilateral: Secondary | ICD-10-CM | POA: Diagnosis not present

## 2020-01-31 DIAGNOSIS — M0579 Rheumatoid arthritis with rheumatoid factor of multiple sites without organ or systems involvement: Secondary | ICD-10-CM

## 2020-01-31 DIAGNOSIS — I10 Essential (primary) hypertension: Secondary | ICD-10-CM

## 2020-01-31 DIAGNOSIS — I872 Venous insufficiency (chronic) (peripheral): Secondary | ICD-10-CM | POA: Diagnosis not present

## 2020-01-31 NOTE — Progress Notes (Signed)
Date:  01/31/2020   Name:  Hayley Ewing   DOB:  1946/04/22   MRN:  [id number]   Chief Complaint: Edema (follow up legs/feet) and Hypertension (low readings)  Cardiology note 01/15/20: Called and spoke with patient to review labs from yesterday. BMP with Cr 1.3 and potassium 5.5. She reports this morning her BP is 131/73, has not taken her morning medications. I am concerned that taking all of her antihypertensives will make her hypotensive again so will adjust plan as follows: -Continue reduced dose of carvedilol 6.33m twice daily -continue lisinopril 224mdaily -STOP spironolactone  -Repeat BMP Monday and follow-up BP at that point -May consider restarting low dose spiro if needed for BP control  -AlDarnelle CatalanPA-C  She is here to follow up BP.  She was in the hospital and was diuresed with IV lasix.  Overall she has lost 40+ lbs. She is now on reduced dose of medications.  BP are running on the low side but she feels well.  RA - now off of prednisone and MTX.  She is taking vicodin and gabapentin.  HPI  Lab Results  Component Value Date   CREATININE 1.1 01/29/2020   BUN 21 01/29/2020   NA 140 01/29/2020   K 5.0 01/29/2020   CL 105 01/29/2020   CO2 25 (A) 01/29/2020   Lab Results  Component Value Date   CHOL 218 (H) 10/22/2019   HDL 92 10/22/2019   LDLCALC 105 (H) 10/22/2019   TRIG 124 10/22/2019   CHOLHDL 2.4 10/22/2019   Lab Results  Component Value Date   TSH 0.448 (L) 10/22/2019   Lab Results  Component Value Date   HGBA1C 5.6 10/22/2019   Lab Results  Component Value Date   WBC 7.5 01/29/2020   HGB 12.0 01/29/2020   HCT 40 01/29/2020   MCV 79 12/18/2019   PLT 265 01/29/2020   Lab Results  Component Value Date   ALT 22 10/22/2019   AST 11 10/22/2019   ALKPHOS 58 10/22/2019   BILITOT 0.5 10/22/2019     Review of Systems  Constitutional: Negative for chills, fatigue and fever.  Respiratory: Negative for cough and chest tightness.     Cardiovascular: Negative for chest pain, palpitations and leg swelling.  Gastrointestinal: Negative for constipation and diarrhea.  Neurological: Positive for light-headedness (mild occasional lightheadedness). Negative for dizziness and headaches.  Psychiatric/Behavioral: Negative for dysphoric mood and sleep disturbance. The patient is not nervous/anxious.     Patient Active Problem List   Diagnosis Date Noted  . Long term (current) use of anticoagulants 01/23/2020  . Chronic diastolic heart failure ([place]07/07/2019  . Iron deficiency anemia 12/18/2019  . Acute deep vein thrombosis (DVT) of distal vein of both lower extremities ([place]05/17/2021  . Current chronic use of systemic steroids 11/15/2019  . Generalized osteoarthritis 10/22/2019  . Heart failure with reduced ejection fraction ([place]CP[place]02/22/2021  . Chondrocalcinosis 01/15/2018  . Rheumatoid arthritis ([place]06/24/2019  . Colitis 08/31/2017  . Essential hypertension 08/10/2017  . Asthma, persistent controlled 07/10/2017  . Displacement of lumbar intervertebral disc without myelopathy 01/30/2017  . Venous insufficiency of both lower extremities 06/14/2016  . Bladder cystocele 05/19/2015  . S/P hip replacement, right 05/19/2015    No Known Allergies  Past Surgical History:  Procedure Laterality Date  . TOTAL ABDOMINAL HYSTERECTOMY  1998  . TOTAL HIP ARTHROPLASTY Right 2014    Social History   Tobacco Use  . Smoking status: Never Smoker  .  Smokeless tobacco: Never Used  . Tobacco comment: smoking cessation materials not required  Vaping Use  . Vaping Use: Never used  Substance Use Topics  . Alcohol use: Not Currently  . Drug use: No     Medication list has been reviewed and updated.  Current Meds  Medication Sig  . carvedilol (COREG) 12.5 MG tablet Take 12.5 mg by mouth 2 (two) times daily with a meal.   . DULoxetine (CYMBALTA) 60 MG capsule Take 60 mg by mouth daily.  Marland Kitchen ELIQUIS 5 MG TABS tablet Take 1  tablet by mouth 2 (two) times daily.  . ferrous sulfate 325 (65 FE) MG tablet Take 325 mg by mouth daily with breakfast.  . folic acid (FOLVITE) 1 MG tablet   . gabapentin (NEURONTIN) 100 MG capsule Take 200 mg by mouth 3 (three) times daily as needed.  Marland Kitchen HYDROcodone-acetaminophen (NORCO) 10-325 MG tablet Take 1 tablet by mouth 2 (two) times daily.   Marland Kitchen lidocaine (LIDODERM) 5 % Place 1 patch onto the skin daily. Remove & Discard patch within 12 hours or as directed by MD  . lisinopril (ZESTRIL) 40 MG tablet Take 40 mg by mouth daily.  Marland Kitchen NARCAN 4 MG/0.1ML LIQD nasal spray kit SMARTSIG:1 Spray(s) Both Nares Once PRN   Current Facility-Administered Medications for the 01/31/20 encounter (Office Visit) with Glean Hess, MD  Medication  . albuterol (PROVENTIL) (2.5 MG/3ML) 0.083% nebulizer solution 2.5 mg    PHQ 2/9 Scores 01/31/2020 12/18/2019 11/18/2019 11/15/2019  PHQ - 2 Score 0 2 2 2   PHQ- 9 Score 3 2 2 2     GAD 7 : Generalized Anxiety Score 01/31/2020 12/18/2019 11/15/2019  Nervous, Anxious, on Edge 0 0 0  Control/stop worrying 0 0 0  Worry too much - different things 0 0 0  Trouble relaxing 1 0 0  Restless 0 0 0  Easily annoyed or irritable 0 0 0  Afraid - awful might happen 0 0 0  Total GAD 7 Score 1 0 0  Anxiety Difficulty Not difficult at all Not difficult at all Not difficult at all    BP Readings from Last 3 Encounters:  01/31/20 (!) 80/50  12/18/19 (!) 142/48  11/15/19 102/78    Physical Exam Vitals and nursing note reviewed.  Constitutional:      General: She is not in acute distress.    Appearance: She is well-developed.     Comments: Moon facies is much improved   HENT:     Head: Normocephalic and atraumatic.  Cardiovascular:     Rate and Rhythm: Normal rate and regular rhythm.     Pulses: Normal pulses.     Heart sounds: No murmur heard.   Pulmonary:     Effort: Pulmonary effort is normal. No respiratory distress.     Breath sounds: No wheezing or rhonchi.    Musculoskeletal:        General: No tenderness. Normal range of motion.     Cervical back: Normal range of motion.     Right lower leg: No edema.     Left lower leg: No edema.  Lymphadenopathy:     Cervical: No cervical adenopathy.  Skin:    General: Skin is warm and dry.     Capillary Refill: Capillary refill takes less than 2 seconds.     Findings: No rash.  Neurological:     General: No focal deficit present.     Mental Status: She is alert and oriented to person, place,  and time.     Gait: Gait normal.  Psychiatric:        Mood and Affect: Mood normal.     Wt Readings from Last 3 Encounters:  01/31/20 178 lb (80.7 kg)  12/18/19 204 lb (92.5 kg)  11/15/19 183 lb (83 kg)    BP (!) 80/50   Pulse 72   Temp 98.2 F (36.8 C) (Oral)   Ht 5' 4"  (1.626 m)   Wt 178 lb (80.7 kg)   BMI 30.55 kg/m   Assessment and Plan: 1. Essential hypertension Clinically stable exam with well controlled BP. Tolerating medications without side effects at this time. Pt to continue current regimen and low sodium diet; benefits of regular exercise as able discussed.  2. Acute deep vein thrombosis (DVT) of distal vein of both lower extremities ([place]) She is doing well on Eliquis.  No bleeding issues. She should be on the medication for at least 6 months.  3. Venous insufficiency of both lower extremities Much improved edema since diuresis. Labs done 2 days ago were much improved with normal K+ and Cr.  4. Rheumatoid arthritis involving multiple sites with positive rheumatoid factor ([place]) Now off of MTX and prednisone Taking gabapentin for pain. Follow up with Rheumatology next week   Partially dictated using [place]. Any errors are unintentional.  Halina Maidens, MD [facility]  01/31/2020

## 2020-02-06 DIAGNOSIS — I5033 Acute on chronic diastolic (congestive) heart failure: Secondary | ICD-10-CM | POA: Diagnosis not present

## 2020-02-06 DIAGNOSIS — I5020 Unspecified systolic (congestive) heart failure: Secondary | ICD-10-CM | POA: Diagnosis not present

## 2020-02-06 DIAGNOSIS — I872 Venous insufficiency (chronic) (peripheral): Secondary | ICD-10-CM | POA: Diagnosis not present

## 2020-02-06 DIAGNOSIS — I82432 Acute embolism and thrombosis of left popliteal vein: Secondary | ICD-10-CM | POA: Diagnosis not present

## 2020-02-06 DIAGNOSIS — I110 Hypertensive heart disease with heart failure: Secondary | ICD-10-CM | POA: Diagnosis not present

## 2020-02-06 DIAGNOSIS — I82441 Acute embolism and thrombosis of right tibial vein: Secondary | ICD-10-CM | POA: Diagnosis not present

## 2020-02-07 ENCOUNTER — Other Ambulatory Visit: Payer: Self-pay | Admitting: Internal Medicine

## 2020-02-07 ENCOUNTER — Telehealth: Payer: Self-pay | Admitting: Internal Medicine

## 2020-02-07 DIAGNOSIS — I5033 Acute on chronic diastolic (congestive) heart failure: Secondary | ICD-10-CM | POA: Diagnosis not present

## 2020-02-07 DIAGNOSIS — I110 Hypertensive heart disease with heart failure: Secondary | ICD-10-CM | POA: Diagnosis not present

## 2020-02-07 DIAGNOSIS — I5020 Unspecified systolic (congestive) heart failure: Secondary | ICD-10-CM | POA: Diagnosis not present

## 2020-02-07 DIAGNOSIS — I872 Venous insufficiency (chronic) (peripheral): Secondary | ICD-10-CM | POA: Diagnosis not present

## 2020-02-07 DIAGNOSIS — I82432 Acute embolism and thrombosis of left popliteal vein: Secondary | ICD-10-CM | POA: Diagnosis not present

## 2020-02-07 DIAGNOSIS — I82441 Acute embolism and thrombosis of right tibial vein: Secondary | ICD-10-CM | POA: Diagnosis not present

## 2020-02-07 NOTE — Telephone Encounter (Signed)
She needs to call her cardiologist who manages her HTN and heart failure.  I do not want to change anything on the basis of occasional lightheadedness.

## 2020-02-07 NOTE — Telephone Encounter (Signed)
Heather, PT is calling from [facility] calling to report that the patient is having diarrhea and dizziness. However, not today.  Also the patient report low BP. See below for readings. Please advise Herbert Seta is report the following with the patient CB- [phone]  Herbert Seta reports that the Patients BP is ready 02/07/20 11:35a- 120/74   Previous BP reading of the Patient  7/15/217:30p- 81/62  02/06/20 8:00a 94/58  02/05/20 7:30a - 112/73  02/04/20 8:00a- 103/61  02/03/20 7:30a 112/67  02/02/20 6:00a- 95/58   02/02/20 8:00p- 108/64  02/01/20 7:00a-121/71  01/31/20 8:30a 87/55 Patient Saw Dr. Judithann Graves on 01/31/20  Please advise with the patient CB- [phone]

## 2020-02-07 NOTE — Telephone Encounter (Signed)
Tried calling patient - VM not set up to leave a message.   CM

## 2020-02-10 DIAGNOSIS — I5033 Acute on chronic diastolic (congestive) heart failure: Secondary | ICD-10-CM | POA: Diagnosis not present

## 2020-02-10 DIAGNOSIS — I82432 Acute embolism and thrombosis of left popliteal vein: Secondary | ICD-10-CM | POA: Diagnosis not present

## 2020-02-10 DIAGNOSIS — I82441 Acute embolism and thrombosis of right tibial vein: Secondary | ICD-10-CM | POA: Diagnosis not present

## 2020-02-10 DIAGNOSIS — I872 Venous insufficiency (chronic) (peripheral): Secondary | ICD-10-CM | POA: Diagnosis not present

## 2020-02-10 DIAGNOSIS — I5020 Unspecified systolic (congestive) heart failure: Secondary | ICD-10-CM | POA: Diagnosis not present

## 2020-02-10 DIAGNOSIS — I110 Hypertensive heart disease with heart failure: Secondary | ICD-10-CM | POA: Diagnosis not present

## 2020-02-11 DIAGNOSIS — I5033 Acute on chronic diastolic (congestive) heart failure: Secondary | ICD-10-CM | POA: Diagnosis not present

## 2020-02-11 DIAGNOSIS — I5020 Unspecified systolic (congestive) heart failure: Secondary | ICD-10-CM | POA: Diagnosis not present

## 2020-02-11 DIAGNOSIS — I110 Hypertensive heart disease with heart failure: Secondary | ICD-10-CM | POA: Diagnosis not present

## 2020-02-11 DIAGNOSIS — I872 Venous insufficiency (chronic) (peripheral): Secondary | ICD-10-CM | POA: Diagnosis not present

## 2020-02-11 DIAGNOSIS — I82441 Acute embolism and thrombosis of right tibial vein: Secondary | ICD-10-CM | POA: Diagnosis not present

## 2020-02-11 DIAGNOSIS — I82432 Acute embolism and thrombosis of left popliteal vein: Secondary | ICD-10-CM | POA: Diagnosis not present

## 2020-02-12 DIAGNOSIS — Z96641 Presence of right artificial hip joint: Secondary | ICD-10-CM | POA: Diagnosis not present

## 2020-02-12 DIAGNOSIS — M059 Rheumatoid arthritis with rheumatoid factor, unspecified: Secondary | ICD-10-CM | POA: Diagnosis not present

## 2020-02-12 DIAGNOSIS — I5020 Unspecified systolic (congestive) heart failure: Secondary | ICD-10-CM | POA: Diagnosis not present

## 2020-02-12 DIAGNOSIS — I088 Other rheumatic multiple valve diseases: Secondary | ICD-10-CM | POA: Diagnosis not present

## 2020-02-12 DIAGNOSIS — I872 Venous insufficiency (chronic) (peripheral): Secondary | ICD-10-CM | POA: Diagnosis not present

## 2020-02-12 DIAGNOSIS — I5033 Acute on chronic diastolic (congestive) heart failure: Secondary | ICD-10-CM | POA: Diagnosis not present

## 2020-02-12 DIAGNOSIS — D509 Iron deficiency anemia, unspecified: Secondary | ICD-10-CM | POA: Diagnosis not present

## 2020-02-12 DIAGNOSIS — Z90710 Acquired absence of both cervix and uterus: Secondary | ICD-10-CM | POA: Diagnosis not present

## 2020-02-12 DIAGNOSIS — I82441 Acute embolism and thrombosis of right tibial vein: Secondary | ICD-10-CM | POA: Diagnosis not present

## 2020-02-12 DIAGNOSIS — M1991 Primary osteoarthritis, unspecified site: Secondary | ICD-10-CM | POA: Diagnosis not present

## 2020-02-12 DIAGNOSIS — I110 Hypertensive heart disease with heart failure: Secondary | ICD-10-CM | POA: Diagnosis not present

## 2020-02-12 DIAGNOSIS — Z7901 Long term (current) use of anticoagulants: Secondary | ICD-10-CM | POA: Diagnosis not present

## 2020-02-12 DIAGNOSIS — I82432 Acute embolism and thrombosis of left popliteal vein: Secondary | ICD-10-CM | POA: Diagnosis not present

## 2020-02-12 DIAGNOSIS — J45909 Unspecified asthma, uncomplicated: Secondary | ICD-10-CM | POA: Diagnosis not present

## 2020-02-12 DIAGNOSIS — I5030 Unspecified diastolic (congestive) heart failure: Secondary | ICD-10-CM | POA: Diagnosis not present

## 2020-02-17 ENCOUNTER — Telehealth: Payer: Self-pay | Admitting: Internal Medicine

## 2020-02-17 DIAGNOSIS — I5033 Acute on chronic diastolic (congestive) heart failure: Secondary | ICD-10-CM | POA: Diagnosis not present

## 2020-02-17 DIAGNOSIS — I872 Venous insufficiency (chronic) (peripheral): Secondary | ICD-10-CM | POA: Diagnosis not present

## 2020-02-17 DIAGNOSIS — I82432 Acute embolism and thrombosis of left popliteal vein: Secondary | ICD-10-CM | POA: Diagnosis not present

## 2020-02-17 DIAGNOSIS — I82441 Acute embolism and thrombosis of right tibial vein: Secondary | ICD-10-CM | POA: Diagnosis not present

## 2020-02-17 DIAGNOSIS — I5020 Unspecified systolic (congestive) heart failure: Secondary | ICD-10-CM | POA: Diagnosis not present

## 2020-02-17 DIAGNOSIS — I110 Hypertensive heart disease with heart failure: Secondary | ICD-10-CM | POA: Diagnosis not present

## 2020-02-17 NOTE — Telephone Encounter (Signed)
Left VM for verbal orders.  CM

## 2020-02-17 NOTE — Telephone Encounter (Signed)
Called to get verbal orders for continuing home health for balance - 1xwk 4.  CB# [phone]

## 2020-02-18 DIAGNOSIS — M25561 Pain in right knee: Secondary | ICD-10-CM | POA: Diagnosis not present

## 2020-02-20 DIAGNOSIS — M069 Rheumatoid arthritis, unspecified: Secondary | ICD-10-CM | POA: Diagnosis not present

## 2020-02-20 DIAGNOSIS — I82441 Acute embolism and thrombosis of right tibial vein: Secondary | ICD-10-CM | POA: Diagnosis not present

## 2020-02-20 DIAGNOSIS — I5033 Acute on chronic diastolic (congestive) heart failure: Secondary | ICD-10-CM | POA: Diagnosis not present

## 2020-02-20 DIAGNOSIS — I82432 Acute embolism and thrombosis of left popliteal vein: Secondary | ICD-10-CM | POA: Diagnosis not present

## 2020-02-20 DIAGNOSIS — I5020 Unspecified systolic (congestive) heart failure: Secondary | ICD-10-CM | POA: Diagnosis not present

## 2020-02-20 DIAGNOSIS — I872 Venous insufficiency (chronic) (peripheral): Secondary | ICD-10-CM | POA: Diagnosis not present

## 2020-02-20 DIAGNOSIS — I110 Hypertensive heart disease with heart failure: Secondary | ICD-10-CM | POA: Diagnosis not present

## 2020-02-24 DIAGNOSIS — I5033 Acute on chronic diastolic (congestive) heart failure: Secondary | ICD-10-CM | POA: Diagnosis not present

## 2020-02-24 DIAGNOSIS — I110 Hypertensive heart disease with heart failure: Secondary | ICD-10-CM | POA: Diagnosis not present

## 2020-02-24 DIAGNOSIS — I5020 Unspecified systolic (congestive) heart failure: Secondary | ICD-10-CM | POA: Diagnosis not present

## 2020-02-24 DIAGNOSIS — I82432 Acute embolism and thrombosis of left popliteal vein: Secondary | ICD-10-CM | POA: Diagnosis not present

## 2020-02-24 DIAGNOSIS — I82441 Acute embolism and thrombosis of right tibial vein: Secondary | ICD-10-CM | POA: Diagnosis not present

## 2020-02-24 DIAGNOSIS — I872 Venous insufficiency (chronic) (peripheral): Secondary | ICD-10-CM | POA: Diagnosis not present

## 2020-02-26 DIAGNOSIS — I5020 Unspecified systolic (congestive) heart failure: Secondary | ICD-10-CM | POA: Diagnosis not present

## 2020-02-26 DIAGNOSIS — I5033 Acute on chronic diastolic (congestive) heart failure: Secondary | ICD-10-CM | POA: Diagnosis not present

## 2020-02-26 DIAGNOSIS — I82441 Acute embolism and thrombosis of right tibial vein: Secondary | ICD-10-CM | POA: Diagnosis not present

## 2020-02-26 DIAGNOSIS — I82432 Acute embolism and thrombosis of left popliteal vein: Secondary | ICD-10-CM | POA: Diagnosis not present

## 2020-02-26 DIAGNOSIS — I872 Venous insufficiency (chronic) (peripheral): Secondary | ICD-10-CM | POA: Diagnosis not present

## 2020-02-26 DIAGNOSIS — I110 Hypertensive heart disease with heart failure: Secondary | ICD-10-CM | POA: Diagnosis not present

## 2020-03-03 ENCOUNTER — Other Ambulatory Visit: Payer: Self-pay | Admitting: Internal Medicine

## 2020-03-03 DIAGNOSIS — Z7901 Long term (current) use of anticoagulants: Secondary | ICD-10-CM

## 2020-03-03 DIAGNOSIS — M0579 Rheumatoid arthritis with rheumatoid factor of multiple sites without organ or systems involvement: Secondary | ICD-10-CM

## 2020-03-03 DIAGNOSIS — D509 Iron deficiency anemia, unspecified: Secondary | ICD-10-CM

## 2020-03-03 DIAGNOSIS — I5020 Unspecified systolic (congestive) heart failure: Secondary | ICD-10-CM

## 2020-03-03 DIAGNOSIS — M159 Polyosteoarthritis, unspecified: Secondary | ICD-10-CM

## 2020-03-03 DIAGNOSIS — I824Z3 Acute embolism and thrombosis of unspecified deep veins of distal lower extremity, bilateral: Secondary | ICD-10-CM

## 2020-03-03 DIAGNOSIS — I872 Venous insufficiency (chronic) (peripheral): Secondary | ICD-10-CM

## 2020-03-03 DIAGNOSIS — Z96641 Presence of right artificial hip joint: Secondary | ICD-10-CM

## 2020-03-03 DIAGNOSIS — I5032 Chronic diastolic (congestive) heart failure: Secondary | ICD-10-CM

## 2020-03-03 DIAGNOSIS — I10 Essential (primary) hypertension: Secondary | ICD-10-CM

## 2020-03-03 DIAGNOSIS — Z90710 Acquired absence of both cervix and uterus: Secondary | ICD-10-CM | POA: Insufficient documentation

## 2020-03-03 NOTE — Progress Notes (Signed)
Received orders from [facility] [facility]. Start of care 12/14/19.   Orders from 02/12/20 through 04/11/20 are reviewed, signed and faxed.

## 2020-03-05 DIAGNOSIS — I82441 Acute embolism and thrombosis of right tibial vein: Secondary | ICD-10-CM | POA: Diagnosis not present

## 2020-03-05 DIAGNOSIS — I110 Hypertensive heart disease with heart failure: Secondary | ICD-10-CM | POA: Diagnosis not present

## 2020-03-05 DIAGNOSIS — I5033 Acute on chronic diastolic (congestive) heart failure: Secondary | ICD-10-CM | POA: Diagnosis not present

## 2020-03-05 DIAGNOSIS — I82432 Acute embolism and thrombosis of left popliteal vein: Secondary | ICD-10-CM | POA: Diagnosis not present

## 2020-03-05 DIAGNOSIS — I872 Venous insufficiency (chronic) (peripheral): Secondary | ICD-10-CM | POA: Diagnosis not present

## 2020-03-05 DIAGNOSIS — I5020 Unspecified systolic (congestive) heart failure: Secondary | ICD-10-CM | POA: Diagnosis not present

## 2020-03-09 DIAGNOSIS — I82432 Acute embolism and thrombosis of left popliteal vein: Secondary | ICD-10-CM | POA: Diagnosis not present

## 2020-03-09 DIAGNOSIS — I82441 Acute embolism and thrombosis of right tibial vein: Secondary | ICD-10-CM | POA: Diagnosis not present

## 2020-03-09 DIAGNOSIS — I5033 Acute on chronic diastolic (congestive) heart failure: Secondary | ICD-10-CM | POA: Diagnosis not present

## 2020-03-09 DIAGNOSIS — I5020 Unspecified systolic (congestive) heart failure: Secondary | ICD-10-CM | POA: Diagnosis not present

## 2020-03-09 DIAGNOSIS — I110 Hypertensive heart disease with heart failure: Secondary | ICD-10-CM | POA: Diagnosis not present

## 2020-03-09 DIAGNOSIS — I872 Venous insufficiency (chronic) (peripheral): Secondary | ICD-10-CM | POA: Diagnosis not present

## 2020-03-12 ENCOUNTER — Telehealth: Payer: Self-pay | Admitting: Internal Medicine

## 2020-03-12 NOTE — Telephone Encounter (Signed)
Copied from CRM [id number]. Topic: General - Inquiry >> Mar 12, 2020  1:59 PM Deborha Payment wrote: Reason for CRM: Patient states she has bronchitis.  Patient is coughing pretty much the entire time on the phone.  Patient is just requesting medication, patient does not want an appt.  Call back [phone]

## 2020-03-13 ENCOUNTER — Encounter: Payer: Self-pay | Admitting: Internal Medicine

## 2020-03-13 ENCOUNTER — Telehealth: Payer: Self-pay

## 2020-03-13 ENCOUNTER — Ambulatory Visit (INDEPENDENT_AMBULATORY_CARE_PROVIDER_SITE_OTHER): Payer: Medicare Other | Admitting: Internal Medicine

## 2020-03-13 VITALS — BP 120/71 | HR 86 | Ht 64.0 in | Wt 172.0 lb

## 2020-03-13 DIAGNOSIS — I82441 Acute embolism and thrombosis of right tibial vein: Secondary | ICD-10-CM | POA: Diagnosis not present

## 2020-03-13 DIAGNOSIS — I5033 Acute on chronic diastolic (congestive) heart failure: Secondary | ICD-10-CM | POA: Diagnosis not present

## 2020-03-13 DIAGNOSIS — J441 Chronic obstructive pulmonary disease with (acute) exacerbation: Secondary | ICD-10-CM

## 2020-03-13 DIAGNOSIS — I5020 Unspecified systolic (congestive) heart failure: Secondary | ICD-10-CM | POA: Diagnosis not present

## 2020-03-13 DIAGNOSIS — J45909 Unspecified asthma, uncomplicated: Secondary | ICD-10-CM | POA: Diagnosis not present

## 2020-03-13 DIAGNOSIS — D509 Iron deficiency anemia, unspecified: Secondary | ICD-10-CM | POA: Diagnosis not present

## 2020-03-13 DIAGNOSIS — Z96641 Presence of right artificial hip joint: Secondary | ICD-10-CM | POA: Diagnosis not present

## 2020-03-13 DIAGNOSIS — I82432 Acute embolism and thrombosis of left popliteal vein: Secondary | ICD-10-CM | POA: Diagnosis not present

## 2020-03-13 DIAGNOSIS — M1991 Primary osteoarthritis, unspecified site: Secondary | ICD-10-CM | POA: Diagnosis not present

## 2020-03-13 DIAGNOSIS — I872 Venous insufficiency (chronic) (peripheral): Secondary | ICD-10-CM | POA: Diagnosis not present

## 2020-03-13 DIAGNOSIS — M059 Rheumatoid arthritis with rheumatoid factor, unspecified: Secondary | ICD-10-CM | POA: Diagnosis not present

## 2020-03-13 DIAGNOSIS — Z90710 Acquired absence of both cervix and uterus: Secondary | ICD-10-CM | POA: Diagnosis not present

## 2020-03-13 DIAGNOSIS — I110 Hypertensive heart disease with heart failure: Secondary | ICD-10-CM | POA: Diagnosis not present

## 2020-03-13 DIAGNOSIS — J45901 Unspecified asthma with (acute) exacerbation: Secondary | ICD-10-CM

## 2020-03-13 DIAGNOSIS — Z7901 Long term (current) use of anticoagulants: Secondary | ICD-10-CM | POA: Diagnosis not present

## 2020-03-13 MED ORDER — AZITHROMYCIN 250 MG PO TABS: ORAL_TABLET | ORAL | 0 refills | Status: AC

## 2020-03-13 NOTE — Progress Notes (Signed)
Date:  03/13/2020   Name:  Hayley Ewing   DOB:  [birth year]   MRN:  [id number]  I connected with this patient, Hayley Ewing, by telephone at the patient's home.  I verified that I am speaking with the correct person using two identifiers. This visit was conducted via telephone due to the Covid-19 outbreak from my office at [facility] in [place], [place]. Virtual visit consent obtained by KP, CMA.  Chief Complaint: Bronchitis (X2 weeks, hoarse, sore throat, coughing up green mucous, no fever)  Cough This is a new problem. The current episode started 1 to 4 weeks ago. The problem has been gradually worsening. The problem occurs every few minutes. The cough is productive of sputum and productive of purulent sputum. Associated symptoms include shortness of breath and wheezing. Pertinent negatives include no chest pain, chills, ear pain, fever, headaches or postnasal drip. She has tried steroid inhaler (has nebulizer but not using it) for the symptoms. The treatment provided no relief. Her past medical history is significant for asthma, bronchitis and COPD.    Lab Results  Component Value Date   CREATININE 1.1 01/29/2020   BUN 21 01/29/2020   NA 140 01/29/2020   K 5.0 01/29/2020   CL 105 01/29/2020   CO2 25 (A) 01/29/2020   Lab Results  Component Value Date   CHOL 218 (H) 10/22/2019   HDL 92 10/22/2019   LDLCALC 105 (H) 10/22/2019   TRIG 124 10/22/2019   CHOLHDL 2.4 10/22/2019   Lab Results  Component Value Date   TSH 0.448 (L) 10/22/2019   Lab Results  Component Value Date   HGBA1C 5.6 10/22/2019   Lab Results  Component Value Date   WBC 7.5 01/29/2020   HGB 12.0 01/29/2020   HCT 40 01/29/2020   MCV 79 12/18/2019   PLT 265 01/29/2020   Lab Results  Component Value Date   ALT 22 10/22/2019   AST 11 10/22/2019   ALKPHOS 58 10/22/2019   BILITOT 0.5 10/22/2019     Review of Systems  Constitutional: Positive for fatigue. Negative for chills, diaphoresis and  fever.  HENT: Negative for ear pain, postnasal drip and sinus pain.   Respiratory: Positive for cough, chest tightness, shortness of breath and wheezing.   Cardiovascular: Negative for chest pain and palpitations.  Neurological: Negative for dizziness, light-headedness and headaches.    Patient Active Problem List   Diagnosis Date Noted  . Acquired absence of both cervix and uterus 03/03/2020  . Long term (current) use of anticoagulants 01/23/2020  . Chronic diastolic heart failure ([place]) 01/23/2020  . Iron deficiency anemia 12/18/2019  . Acute deep vein thrombosis (DVT) of distal vein of both lower extremities ([place]) 12/09/2019  . Current chronic use of systemic steroids 11/15/2019  . Generalized osteoarthritis 10/22/2019  . Heart failure with reduced ejection fraction ([place]) 09/16/2019  . Chondrocalcinosis 01/15/2018  . Rheumatoid arthritis ([place]) 01/15/2018  . Colitis 08/31/2017  . Essential hypertension 08/10/2017  . Asthma, persistent controlled 07/10/2017  . Displacement of lumbar intervertebral disc without myelopathy 01/30/2017  . Venous insufficiency of both lower extremities 06/14/2016  . Bladder cystocele 05/19/2015  . S/P hip replacement, right 05/19/2015    No Known Allergies  Past Surgical History:  Procedure Laterality Date  . TOTAL ABDOMINAL HYSTERECTOMY  1998  . TOTAL HIP ARTHROPLASTY Right 2014    Social History   Tobacco Use  . Smoking status: Never Smoker  . Smokeless tobacco: Never Used  . Tobacco comment:  smoking cessation materials not required  Vaping Use  . Vaping Use: Never used  Substance Use Topics  . Alcohol use: Not Currently  . Drug use: No     Medication list has been reviewed and updated.  Current Meds  Medication Sig  . carvedilol (COREG) 6.25 MG tablet Take 6.25 mg by mouth 2 (two) times daily.  . DULoxetine (CYMBALTA) 60 MG capsule Take 60 mg by mouth daily.  . ELIQUIS 5 MG TABS tablet Take 1 tablet by mouth 2 (two) times daily.    . ferrous sulfate 325 (65 FE) MG tablet Take 325 mg by mouth daily with breakfast.  . folic acid (FOLVITE) 1 MG tablet   . furosemide (LASIX) 40 MG tablet Take 40 mg by mouth.  . gabapentin (NEURONTIN) 100 MG capsule Take 200 mg by mouth 3 (three) times daily as needed.  . HYDROcodone-acetaminophen (NORCO) 10-325 MG tablet Take 1 tablet by mouth 2 (two) times daily.   . lidocaine (LIDODERM) 5 % Place 1 patch onto the skin daily. Remove & Discard patch within 12 hours or as directed by MD  . lisinopril (ZESTRIL) 40 MG tablet Take 40 mg by mouth daily.  . methotrexate 2.5 MG tablet   . NARCAN 4 MG/0.1ML LIQD nasal spray kit SMARTSIG:1 Spray(s) Both Nares Once PRN   Current Facility-Administered Medications for the 03/13/20 encounter (Office Visit) with Berglund, Laura H, MD  Medication  . albuterol (PROVENTIL) (2.5 MG/3ML) 0.083% nebulizer solution 2.5 mg    PHQ 2/9 Scores 01/31/2020 12/18/2019 11/18/2019 11/15/2019  PHQ - 2 Score 0 2 2 2  PHQ- 9 Score 3 2 2 2    GAD 7 : Generalized Anxiety Score 01/31/2020 12/18/2019 11/15/2019  Nervous, Anxious, on Edge 0 0 0  Control/stop worrying 0 0 0  Worry too much - different things 0 0 0  Trouble relaxing 1 0 0  Restless 0 0 0  Easily annoyed or irritable 0 0 0  Afraid - awful might happen 0 0 0  Total GAD 7 Score 1 0 0  Anxiety Difficulty Not difficult at all Not difficult at all Not difficult at all    BP Readings from Last 3 Encounters:  03/13/20 120/71  01/31/20 (!) 80/50  12/18/19 (!) 142/48    Physical Exam Pulmonary:     Comments: Frequent tight cough with wheezes during the exam Neurological:     Mental Status: She is alert.  Psychiatric:        Attention and Perception: Attention normal.        Mood and Affect: Mood normal.     Wt Readings from Last 3 Encounters:  03/13/20 172 lb (78 kg)  01/31/20 178 lb (80.7 kg)  12/18/19 204 lb (92.5 kg)    BP 120/71   Pulse 86   Ht 5' 4" (1.626 m)   Wt 172 lb (78 kg)   BMI 29.52  kg/m   Assessment and Plan: 1. Acute exacerbation of COPD with asthma (HCC) Continue symbicort MDI Resume Albuterol nebs tid Add mucinex bid and increase fluids - azithromycin (ZITHROMAX Z-PAK) 250 MG tablet; UAD  Dispense: 6 each; Refill: 0  I spent 12 minutes on this encounter. Partially dictated using Dragon software. Any errors are unintentional.  Laura Berglund, MD [facility]  03/13/2020     

## 2020-03-13 NOTE — Telephone Encounter (Signed)
This visit type is being conducted due to national recommendations for restrictions regarding the COVID- 19 Pandemic (e.g. social distancing) in effort to limit this patients exposure and mitigate transmission in our community. This visit type is felt to be most appropriate for this patient at this time. I connected with the patient today and received telephone consent from the patient and patient understand this consent will be good for 1 year.  KP  

## 2020-03-13 NOTE — Progress Notes (Deleted)
This visit type is being conducted due to national recommendations for restrictions regarding the COVID- 19 Pandemic (e.g. social distancing) in effort to limit this patients exposure and mitigate transmission in our community. This visit type is felt to be most appropriate for this patient at this time. I connected with the patient today and received telephone consent from the patient and patient understand this consent will be good for 1 year.  KP  

## 2020-03-16 DIAGNOSIS — I872 Venous insufficiency (chronic) (peripheral): Secondary | ICD-10-CM | POA: Diagnosis not present

## 2020-03-16 DIAGNOSIS — I5033 Acute on chronic diastolic (congestive) heart failure: Secondary | ICD-10-CM | POA: Diagnosis not present

## 2020-03-16 DIAGNOSIS — I5020 Unspecified systolic (congestive) heart failure: Secondary | ICD-10-CM | POA: Diagnosis not present

## 2020-03-16 DIAGNOSIS — I82432 Acute embolism and thrombosis of left popliteal vein: Secondary | ICD-10-CM | POA: Diagnosis not present

## 2020-03-16 DIAGNOSIS — I110 Hypertensive heart disease with heart failure: Secondary | ICD-10-CM | POA: Diagnosis not present

## 2020-03-16 DIAGNOSIS — I82441 Acute embolism and thrombosis of right tibial vein: Secondary | ICD-10-CM | POA: Diagnosis not present

## 2020-03-25 DIAGNOSIS — I82441 Acute embolism and thrombosis of right tibial vein: Secondary | ICD-10-CM | POA: Diagnosis not present

## 2020-03-25 DIAGNOSIS — I5020 Unspecified systolic (congestive) heart failure: Secondary | ICD-10-CM | POA: Diagnosis not present

## 2020-03-25 DIAGNOSIS — I5033 Acute on chronic diastolic (congestive) heart failure: Secondary | ICD-10-CM | POA: Diagnosis not present

## 2020-03-25 DIAGNOSIS — I110 Hypertensive heart disease with heart failure: Secondary | ICD-10-CM | POA: Diagnosis not present

## 2020-03-25 DIAGNOSIS — I872 Venous insufficiency (chronic) (peripheral): Secondary | ICD-10-CM | POA: Diagnosis not present

## 2020-03-25 DIAGNOSIS — I82432 Acute embolism and thrombosis of left popliteal vein: Secondary | ICD-10-CM | POA: Diagnosis not present

## 2020-03-27 DIAGNOSIS — M791 Myalgia, unspecified site: Secondary | ICD-10-CM | POA: Diagnosis not present

## 2020-03-27 DIAGNOSIS — Z79899 Other long term (current) drug therapy: Secondary | ICD-10-CM | POA: Diagnosis not present

## 2020-03-27 DIAGNOSIS — M19019 Primary osteoarthritis, unspecified shoulder: Secondary | ICD-10-CM | POA: Diagnosis not present

## 2020-03-27 DIAGNOSIS — G894 Chronic pain syndrome: Secondary | ICD-10-CM | POA: Diagnosis not present

## 2020-03-27 DIAGNOSIS — M179 Osteoarthritis of knee, unspecified: Secondary | ICD-10-CM | POA: Diagnosis not present

## 2020-03-27 DIAGNOSIS — Z79891 Long term (current) use of opiate analgesic: Secondary | ICD-10-CM | POA: Diagnosis not present

## 2020-03-27 DIAGNOSIS — F418 Other specified anxiety disorders: Secondary | ICD-10-CM | POA: Diagnosis not present

## 2020-03-27 DIAGNOSIS — M47817 Spondylosis without myelopathy or radiculopathy, lumbosacral region: Secondary | ICD-10-CM | POA: Diagnosis not present

## 2020-03-27 DIAGNOSIS — M5126 Other intervertebral disc displacement, lumbar region: Secondary | ICD-10-CM | POA: Diagnosis not present

## 2020-03-27 DIAGNOSIS — M25519 Pain in unspecified shoulder: Secondary | ICD-10-CM | POA: Diagnosis not present

## 2020-03-27 DIAGNOSIS — M159 Polyosteoarthritis, unspecified: Secondary | ICD-10-CM | POA: Diagnosis not present

## 2020-03-27 DIAGNOSIS — M5416 Radiculopathy, lumbar region: Secondary | ICD-10-CM | POA: Diagnosis not present

## 2020-04-01 DIAGNOSIS — I10 Essential (primary) hypertension: Secondary | ICD-10-CM | POA: Diagnosis not present

## 2020-04-01 DIAGNOSIS — I5020 Unspecified systolic (congestive) heart failure: Secondary | ICD-10-CM | POA: Diagnosis not present

## 2020-04-01 DIAGNOSIS — Z86718 Personal history of other venous thrombosis and embolism: Secondary | ICD-10-CM | POA: Diagnosis not present

## 2020-04-02 ENCOUNTER — Telehealth: Payer: Self-pay

## 2020-04-02 NOTE — Chronic Care Management (AMB) (Signed)
  Chronic Care Management   Note  04/02/2020 Name: Rhona Fusilier MRN: [id number] DOB: 1946-06-24  Hayley Ewing is a 74 y.o. year old female who is a primary care patient of Glean Hess, MD. I reached out to Ricardo Jericho by phone today in response to a referral sent by Ms. [employer] health plan.     Ms. Bohle was given information about Chronic Care Management services today including:  1. CCM service includes personalized support from designated clinical staff supervised by her physician, including individualized plan of care and coordination with other care providers 2. 24/7 contact phone numbers for assistance for urgent and routine care needs. 3. Service will only be billed when office clinical staff spend 20 minutes or more in a month to coordinate care. 4. Only one practitioner may furnish and bill the service in a calendar month. 5. The patient may stop CCM services at any time (effective at the end of the month) by phone call to the office staff. 6. The patient will be responsible for cost sharing (co-pay) of up to 20% of the service fee (after annual deductible is met).  Patient did not agree to enrollment in care management services and does not wish to consider at this time.  Follow up plan: The patient has been provided with contact information for the care management team and has been advised to call with any health related questions or concerns.   Noreene Larsson, [place] [place], [place], [place] [address] Direct Dial: [phone] [email or web address] [email or web address]

## 2020-04-08 DIAGNOSIS — I872 Venous insufficiency (chronic) (peripheral): Secondary | ICD-10-CM | POA: Diagnosis not present

## 2020-04-08 DIAGNOSIS — I5020 Unspecified systolic (congestive) heart failure: Secondary | ICD-10-CM | POA: Diagnosis not present

## 2020-04-08 DIAGNOSIS — I110 Hypertensive heart disease with heart failure: Secondary | ICD-10-CM | POA: Diagnosis not present

## 2020-04-08 DIAGNOSIS — I82432 Acute embolism and thrombosis of left popliteal vein: Secondary | ICD-10-CM | POA: Diagnosis not present

## 2020-04-08 DIAGNOSIS — I82441 Acute embolism and thrombosis of right tibial vein: Secondary | ICD-10-CM | POA: Diagnosis not present

## 2020-04-08 DIAGNOSIS — I5033 Acute on chronic diastolic (congestive) heart failure: Secondary | ICD-10-CM | POA: Diagnosis not present

## 2020-04-15 DIAGNOSIS — M159 Polyosteoarthritis, unspecified: Secondary | ICD-10-CM | POA: Diagnosis not present

## 2020-04-15 DIAGNOSIS — M069 Rheumatoid arthritis, unspecified: Secondary | ICD-10-CM | POA: Diagnosis not present

## 2020-04-24 DIAGNOSIS — M1711 Unilateral primary osteoarthritis, right knee: Secondary | ICD-10-CM | POA: Diagnosis not present

## 2020-04-28 DIAGNOSIS — R0600 Dyspnea, unspecified: Secondary | ICD-10-CM | POA: Diagnosis not present

## 2020-04-28 DIAGNOSIS — I10 Essential (primary) hypertension: Secondary | ICD-10-CM | POA: Diagnosis not present

## 2020-04-28 DIAGNOSIS — I5033 Acute on chronic diastolic (congestive) heart failure: Secondary | ICD-10-CM | POA: Diagnosis not present

## 2020-05-19 DIAGNOSIS — S8002XA Contusion of left knee, initial encounter: Secondary | ICD-10-CM | POA: Diagnosis not present

## 2020-05-19 DIAGNOSIS — H1131 Conjunctival hemorrhage, right eye: Secondary | ICD-10-CM | POA: Diagnosis not present

## 2020-05-19 DIAGNOSIS — M1711 Unilateral primary osteoarthritis, right knee: Secondary | ICD-10-CM | POA: Diagnosis not present

## 2020-06-26 DIAGNOSIS — M19019 Primary osteoarthritis, unspecified shoulder: Secondary | ICD-10-CM | POA: Diagnosis not present

## 2020-06-26 DIAGNOSIS — M5416 Radiculopathy, lumbar region: Secondary | ICD-10-CM | POA: Diagnosis not present

## 2020-06-26 DIAGNOSIS — Z79899 Other long term (current) drug therapy: Secondary | ICD-10-CM | POA: Diagnosis not present

## 2020-06-26 DIAGNOSIS — M47817 Spondylosis without myelopathy or radiculopathy, lumbosacral region: Secondary | ICD-10-CM | POA: Diagnosis not present

## 2020-06-26 DIAGNOSIS — M5126 Other intervertebral disc displacement, lumbar region: Secondary | ICD-10-CM | POA: Diagnosis not present

## 2020-06-26 DIAGNOSIS — M179 Osteoarthritis of knee, unspecified: Secondary | ICD-10-CM | POA: Diagnosis not present

## 2020-06-26 DIAGNOSIS — M159 Polyosteoarthritis, unspecified: Secondary | ICD-10-CM | POA: Diagnosis not present

## 2020-06-26 DIAGNOSIS — M791 Myalgia, unspecified site: Secondary | ICD-10-CM | POA: Diagnosis not present

## 2020-06-26 DIAGNOSIS — M25519 Pain in unspecified shoulder: Secondary | ICD-10-CM | POA: Diagnosis not present

## 2020-06-26 DIAGNOSIS — F418 Other specified anxiety disorders: Secondary | ICD-10-CM | POA: Diagnosis not present

## 2020-06-26 DIAGNOSIS — Z79891 Long term (current) use of opiate analgesic: Secondary | ICD-10-CM | POA: Diagnosis not present

## 2020-06-26 DIAGNOSIS — G894 Chronic pain syndrome: Secondary | ICD-10-CM | POA: Diagnosis not present

## 2020-07-08 DIAGNOSIS — M25661 Stiffness of right knee, not elsewhere classified: Secondary | ICD-10-CM | POA: Diagnosis not present

## 2020-07-08 DIAGNOSIS — Z01818 Encounter for other preprocedural examination: Secondary | ICD-10-CM | POA: Diagnosis not present

## 2020-07-08 DIAGNOSIS — M25561 Pain in right knee: Secondary | ICD-10-CM | POA: Diagnosis not present

## 2020-07-08 DIAGNOSIS — M1711 Unilateral primary osteoarthritis, right knee: Secondary | ICD-10-CM | POA: Diagnosis not present

## 2020-07-22 DIAGNOSIS — Z01812 Encounter for preprocedural laboratory examination: Secondary | ICD-10-CM | POA: Diagnosis not present

## 2020-07-22 DIAGNOSIS — M1711 Unilateral primary osteoarthritis, right knee: Secondary | ICD-10-CM | POA: Diagnosis not present

## 2020-07-28 HISTORY — PX: REPLACEMENT TOTAL KNEE: SUR1224

## 2020-11-11 DIAGNOSIS — S81819A Laceration without foreign body, unspecified lower leg, initial encounter: Secondary | ICD-10-CM | POA: Insufficient documentation

## 2020-11-18 ENCOUNTER — Ambulatory Visit (INDEPENDENT_AMBULATORY_CARE_PROVIDER_SITE_OTHER): Payer: Medicare Other

## 2020-11-18 DIAGNOSIS — Z0000 Encounter for general adult medical examination without abnormal findings: Secondary | ICD-10-CM | POA: Diagnosis not present

## 2020-11-18 NOTE — Patient Instructions (Signed)
Ms. Hayley Ewing , Thank you for taking time to come for your Medicare Wellness Visit. I appreciate your ongoing commitment to your health goals. Please review the following plan we discussed and let me know if I can assist you in the future.   Screening recommendations/referrals: Colonoscopy: done 12/10/19. Please contact gastroenterology at [facility] for repeat screening colonoscopy.  Mammogram: done 08/28/18. Please call [phone] to schedule your mammogram and bone density screening.  Bone Density: due Recommended yearly ophthalmology/optometry visit for glaucoma screening and checkup Recommended yearly dental visit for hygiene and checkup  Vaccinations: Influenza vaccine: declined Pneumococcal vaccine: done 11/09/17 Tdap vaccine: done 10/22/19 Shingles vaccine: n/a    Covid-19:done 11/18/19 & 12/17/19  Advanced directives: Please bring a copy of your health care power of attorney and living will to the office at your convenience once you have completed those documents.   Conditions/risks identified: Recommend increasing physical activity as tolerated.   Next appointment: Follow up in one year for your annual wellness visit    Preventive Care 65 Years and Older, Female Preventive care refers to lifestyle choices and visits with your health care provider that can promote health and wellness. What does preventive care include?  A yearly physical exam. This is also called an annual well check.  Dental exams once or twice a year.  Routine eye exams. Ask your health care provider how often you should have your eyes checked.  Personal lifestyle choices, including:  Daily care of your teeth and gums.  Regular physical activity.  Eating a healthy diet.  Avoiding tobacco and drug use.  Limiting alcohol use.  Practicing safe sex.  Taking low-dose aspirin every day.  Taking vitamin and mineral supplements as recommended by your health care provider. What happens during an annual well  check? The services and screenings done by your health care provider during your annual well check will depend on your age, overall health, lifestyle risk factors, and family history of disease. Counseling  Your health care provider may ask you questions about your:  Alcohol use.  Tobacco use.  Drug use.  Emotional well-being.  Home and relationship well-being.  Sexual activity.  Eating habits.  History of falls.  Memory and ability to understand (cognition).  Work and work Astronomer.  Reproductive health. Screening  You may have the following tests or measurements:  Height, weight, and BMI.  Blood pressure.  Lipid and cholesterol levels. These may be checked every 5 years, or more frequently if you are over 48 years old.  Skin check.  Lung cancer screening. You may have this screening every year starting at age 45 if you have a 30-pack-year history of smoking and currently smoke or have quit within the past 15 years.  Fecal occult blood test (FOBT) of the stool. You may have this test every year starting at age 84.  Flexible sigmoidoscopy or colonoscopy. You may have a sigmoidoscopy every 5 years or a colonoscopy every 10 years starting at age 91.  Hepatitis C blood test.  Hepatitis B blood test.  Sexually transmitted disease (STD) testing.  Diabetes screening. This is done by checking your blood sugar (glucose) after you have not eaten for a while (fasting). You may have this done every 1-3 years.  Bone density scan. This is done to screen for osteoporosis. You may have this done starting at age 31.  Mammogram. This may be done every 1-2 years. Talk to your health care provider about how often you should have regular mammograms. Talk with  your health care provider about your test results, treatment options, and if necessary, the need for more tests. Vaccines  Your health care provider may recommend certain vaccines, such as:  Influenza vaccine. This is  recommended every year.  Tetanus, diphtheria, and acellular pertussis (Tdap, Td) vaccine. You may need a Td booster every 10 years.  Zoster vaccine. You may need this after age 37.  Pneumococcal 13-valent conjugate (PCV13) vaccine. One dose is recommended after age 53.  Pneumococcal polysaccharide (PPSV23) vaccine. One dose is recommended after age 58. Talk to your health care provider about which screenings and vaccines you need and how often you need them. This information is not intended to replace advice given to you by your health care provider. Make sure you discuss any questions you have with your health care provider. Document Released: 08/07/2015 Document Revised: 03/30/2016 Document Reviewed: 05/12/2015 Elsevier Interactive Patient Education  2017 [employer] Prevention in the Home Falls can cause injuries. They can happen to people of all ages. There are many things you can do to make your home safe and to help prevent falls. What can I do on the outside of my home?  Regularly fix the edges of walkways and driveways and fix any cracks.  Remove anything that might make you trip as you walk through a door, such as a raised step or threshold.  Trim any bushes or trees on the path to your home.  Use bright outdoor lighting.  Clear any walking paths of anything that might make someone trip, such as rocks or tools.  Regularly check to see if handrails are loose or broken. Make sure that both sides of any steps have handrails.  Any raised decks and porches should have guardrails on the edges.  Have any leaves, snow, or ice cleared regularly.  Use sand or salt on walking paths during winter.  Clean up any spills in your garage right away. This includes oil or grease spills. What can I do in the bathroom?  Use night lights.  Install grab bars by the toilet and in the tub and shower. Do not use towel bars as grab bars.  Use non-skid mats or decals in the tub or  shower.  If you need to sit down in the shower, use a plastic, non-slip stool.  Keep the floor dry. Clean up any water that spills on the floor as soon as it happens.  Remove soap buildup in the tub or shower regularly.  Attach bath mats securely with double-sided non-slip rug tape.  Do not have throw rugs and other things on the floor that can make you trip. What can I do in the bedroom?  Use night lights.  Make sure that you have a light by your bed that is easy to reach.  Do not use any sheets or blankets that are too big for your bed. They should not hang down onto the floor.  Have a firm chair that has side arms. You can use this for support while you get dressed.  Do not have throw rugs and other things on the floor that can make you trip. What can I do in the kitchen?  Clean up any spills right away.  Avoid walking on wet floors.  Keep items that you use a lot in easy-to-reach places.  If you need to reach something above you, use a strong step stool that has a grab bar.  Keep electrical cords out of the way.  Do not  use floor polish or wax that makes floors slippery. If you must use wax, use non-skid floor wax.  Do not have throw rugs and other things on the floor that can make you trip. What can I do with my stairs?  Do not leave any items on the stairs.  Make sure that there are handrails on both sides of the stairs and use them. Fix handrails that are broken or loose. Make sure that handrails are as long as the stairways.  Check any carpeting to make sure that it is firmly attached to the stairs. Fix any carpet that is loose or worn.  Avoid having throw rugs at the top or bottom of the stairs. If you do have throw rugs, attach them to the floor with carpet tape.  Make sure that you have a light switch at the top of the stairs and the bottom of the stairs. If you do not have them, ask someone to add them for you. What else can I do to help prevent  falls?  Wear shoes that:  Do not have high heels.  Have rubber bottoms.  Are comfortable and fit you well.  Are closed at the toe. Do not wear sandals.  If you use a stepladder:  Make sure that it is fully opened. Do not climb a closed stepladder.  Make sure that both sides of the stepladder are locked into place.  Ask someone to hold it for you, if possible.  Clearly mark and make sure that you can see:  Any grab bars or handrails.  First and last steps.  Where the edge of each step is.  Use tools that help you move around (mobility aids) if they are needed. These include:  Canes.  Walkers.  Scooters.  Crutches.  Turn on the lights when you go into a dark area. Replace any light bulbs as soon as they burn out.  Set up your furniture so you have a clear path. Avoid moving your furniture around.  If any of your floors are uneven, fix them.  If there are any pets around you, be aware of where they are.  Review your medicines with your doctor. Some medicines can make you feel dizzy. This can increase your chance of falling. Ask your doctor what other things that you can do to help prevent falls. This information is not intended to replace advice given to you by your health care provider. Make sure you discuss any questions you have with your health care provider. Document Released: 05/07/2009 Document Revised: 12/17/2015 Document Reviewed: 08/15/2014 Elsevier Interactive Patient Education  2017 [employer].

## 2020-11-18 NOTE — Progress Notes (Signed)
Subjective:   Emonnie Ewing is a 75 y.o. female who presents for Medicare Annual (Subsequent) preventive examination.  Virtual Visit via Telephone Note  I connected with  Hayley Ewing on 11/18/20 at  9:20 AM EDT by telephone and verified that I am speaking with the correct person using two identifiers.  Location: Patient: home Provider: [facility] Persons participating in the virtual visit: [place]   I discussed the limitations, risks, security and privacy concerns of performing an evaluation and management service by telephone and the availability of in person appointments. The patient expressed understanding and agreed to proceed.  Interactive audio and video telecommunications were attempted between this nurse and patient, however failed, due to patient having technical difficulties OR patient did not have access to video capability.  We continued and completed visit with audio only.  Some vital signs may be absent or patient reported.   Clemetine Marker, LPN    Review of Systems     Cardiac Risk Factors include: advanced age (>6mn, >>48women);hypertension     Objective:    There were no vitals filed for this visit. There is no height or weight on file to calculate BMI.  Advanced Directives 11/18/2020 11/18/2019 11/09/2017 11/01/2016 11/16/2015  Does Patient Have a Medical Advance Directive? No No No No No  Would patient like information on creating a medical advance directive? No - Patient declined No - Patient declined - - No - patient declined information    Current Medications (verified) Outpatient Encounter Medications as of 11/18/2020  Medication Sig  . carvedilol (COREG) 3.125 MG tablet Take 3.125 mg by mouth 2 (two) times daily.  .Marland Kitchendocusate sodium (COLACE) 100 MG capsule Colace 100 mg capsule  Take 1 capsule twice a day by oral route as needed.  . DULoxetine (CYMBALTA) 60 MG capsule Take 60 mg by mouth daily.  . ferrous sulfate 325 (65 FE) MG tablet  Take 325 mg by mouth daily with breakfast.  . folic acid (FOLVITE) 1 MG tablet   . furosemide (LASIX) 40 MG tablet Take 40 mg by mouth.  . lidocaine (LIDODERM) 5 % Place 1 patch onto the skin daily. Remove & Discard patch within 12 hours or as directed by MD  . methotrexate 2.5 MG tablet   . oxyCODONE-acetaminophen (PERCOCET) 10-325 MG tablet Take 1 tablet by mouth 4 (four) times daily as needed.  .Alveda Reasons10 MG TABS tablet Take 10 mg by mouth daily.  .Marland KitchenNARCAN 4 MG/0.1ML LIQD nasal spray kit SMARTSIG:1 Spray(s) Both Nares Once PRN  . [DISCONTINUED] carvedilol (COREG) 6.25 MG tablet Take 6.25 mg by mouth 2 (two) times daily.  . [DISCONTINUED] ELIQUIS 5 MG TABS tablet Take 1 tablet by mouth 2 (two) times daily.  . [DISCONTINUED] gabapentin (NEURONTIN) 100 MG capsule Take 200 mg by mouth 3 (three) times daily as needed.  . [DISCONTINUED] HYDROcodone-acetaminophen (NORCO) 10-325 MG tablet Take 1 tablet by mouth 2 (two) times daily.   . [DISCONTINUED] lisinopril (ZESTRIL) 40 MG tablet Take 40 mg by mouth daily.   Facility-Administered Encounter Medications as of 11/18/2020  Medication  . albuterol (PROVENTIL) (2.5 MG/3ML) 0.083% nebulizer solution 2.5 mg    Allergies (verified) Patient has no known allergies.   History: Past Medical History:  Diagnosis Date  . Asthma   . Bursitis   . Cardiomegaly   . Female cystocele   . Hypertension   . Osteoarthritis   . Sinusitis   . Venous insufficiency    Past Surgical History:  Procedure Laterality Date  . REPLACEMENT TOTAL KNEE Right 07/28/2020  . TOTAL ABDOMINAL HYSTERECTOMY  1998  . TOTAL HIP ARTHROPLASTY Right 2014   Family History  Problem Relation Age of Onset  . Brain cancer Mother   . Brain cancer Father    Social History   Socioeconomic History  . Marital status: Married    Spouse name: Richardson Landry  . Number of children: 3  . Years of education: some college  . Highest education level: 12th grade  Occupational History  .  Occupation: Retired  Tobacco Use  . Smoking status: Never Smoker  . Smokeless tobacco: Never Used  . Tobacco comment: smoking cessation materials not required  Vaping Use  . Vaping Use: Never used  Substance and Sexual Activity  . Alcohol use: Not Currently  . Drug use: No  . Sexual activity: Not Currently  Other Topics Concern  . Not on file  Social History Narrative  . Not on file   Social Determinants of Health   Financial Resource Strain: Low Risk   . Difficulty of Paying Living Expenses: Not hard at all  Food Insecurity: No Food Insecurity  . Worried About Charity fundraiser in the Last Year: Never true  . Ran Out of Food in the Last Year: Never true  Transportation Needs: No Transportation Needs  . Lack of Transportation (Medical): No  . Lack of Transportation (Non-Medical): No  Physical Activity: Inactive  . Days of Exercise per Week: 0 days  . Minutes of Exercise per Session: 0 min  Stress: Stress Concern Present  . Feeling of Stress : To some extent  Social Connections: Moderately Isolated  . Frequency of Communication with Friends and Family: More than three times a week  . Frequency of Social Gatherings with Friends and Family: More than three times a week  . Attends Religious Services: Never  . Active Member of Clubs or Organizations: No  . Attends Archivist Meetings: Never  . Marital Status: Married    Tobacco Counseling Counseling given: Not Answered Comment: smoking cessation materials not required   Clinical Intake:  Pre-visit preparation completed: Yes  Pain : No/denies pain     Nutritional Risks: None Diabetes: No  How often do you need to have someone help you when you read instructions, pamphlets, or other written materials from your doctor or pharmacy?: 1 - Never    Interpreter Needed?: No  Information entered by :: Clemetine Marker LPN   Activities of Daily Living In your present state of health, do you have any difficulty  performing the following activities: 11/18/2020  Hearing? N  Comment declines hearing aids  Vision? N  Difficulty concentrating or making decisions? N  Walking or climbing stairs? Y  Dressing or bathing? N  Doing errands, shopping? N  Preparing Food and eating ? N  Using the Toilet? N  In the past six months, have you accidently leaked urine? N  Do you have problems with loss of bowel control? N  Managing your Medications? N  Managing your Finances? N  Housekeeping or managing your Housekeeping? N  Some recent data might be hidden    Patient Care Team: Glean Hess, MD as PCP - General (Internal Medicine) Blazing, Venia Carbon, MD as Referring Physician (Cardiology) Hoyle Sauer, MD as Referring Physician (Rheumatology) Bowen, Jeannie Fend, PA-C (Pain Medicine) Melvyn Novas, MD as Referring Physician (Orthopedic Surgery)  Indicate any recent Medical Services you may have received from other than [facility] providers in  the past year (date may be approximate).     Assessment:   This is a routine wellness examination for Hayley Ewing.  Hearing/Vision screen  Hearing Screening   125Hz  250Hz  500Hz  1000Hz  2000Hz  3000Hz  4000Hz  6000Hz  8000Hz   Right ear:           Left ear:           Comments: Pt denies hearing difficulty  Vision Screening Comments: [place] for annual eye exams  Dietary issues and exercise activities discussed: Current Exercise Habits: The patient does not participate in regular exercise at present, Exercise limited by: orthopedic condition(s)  Goals    . DIET - INCREASE WATER INTAKE     Recommend to drink at least 6-8 8oz glasses of water per day.    . Prevent falls     Recommend to remove any items from the home that may cause slips or trips.       Depression Screen PHQ 2/9 Scores 11/18/2020 01/31/2020 12/18/2019 11/18/2019 11/15/2019 10/22/2019 10/04/2019  PHQ - 2 Score 0 0 2 2 2  0 0  PHQ- 9 Score - 3 2 2 2 6  -    Fall Risk Fall Risk   11/18/2020 01/31/2020 12/18/2019 11/18/2019 11/15/2019  Falls in the past year? 1 1 1  0 0  Comment - - - - -  Number falls in past yr: 1 0 1 0 0  Comment - - - - -  Injury with Fall? 1 1 1  0 0  Comment - - - - -  Risk Factor Category  - - - - -  Risk for fall due to : History of fall(s);Impaired balance/gait;Impaired mobility - No Fall Risks No Fall Risks No Fall Risks  Risk for fall due to: Comment - - - - -  Follow up Falls prevention discussed Falls evaluation completed Falls evaluation completed Falls prevention discussed Falls evaluation completed    [place]:  Any stairs in or around the home? Yes  If so, are there any without handrails? No  Home free of loose throw rugs in walkways, pet beds, electrical cords, etc? Yes  Adequate lighting in your home to reduce risk of falls? Yes   ASSISTIVE DEVICES UTILIZED TO PREVENT FALLS:  Life alert? No  Use of a cane, walker or w/c? Yes  Grab bars in the bathroom? Yes  Shower chair or bench in shower? Yes  Elevated toilet seat or a handicapped toilet? Yes   TIMED UP AND GO:  Was the test performed? No . Telephonic visit  Cognitive Function: Normal cognitive status assessed by direct observation by this Nurse Health Advisor. No abnormalities found.       6CIT Screen 11/09/2017 11/01/2016  What Year? 0 points 0 points  What month? 3 points 0 points  What time? 0 points 0 points  Count back from 20 0 points 0 points  Months in reverse 4 points 4 points  Repeat phrase 0 points 0 points  Total Score 7 4    Immunizations Immunization History  Administered Date(s) Administered  . Pneumococcal Conjugate-13 11/01/2016  . Pneumococcal Polysaccharide-23 11/09/2017  . Tdap 04/30/2008, 10/22/2019  . Unspecified SARS-COV-2 Vaccination 11/18/2019, 12/17/2019    TDAP status: Up to date  Flu Vaccine status: Declined, Education has been provided regarding the importance of this vaccine but patient still  declined. Advised may receive this vaccine at local pharmacy or Health Dept. Aware to provide a copy of the vaccination record if  obtained from local pharmacy or Health Dept. Verbalized acceptance and understanding.  Pneumococcal vaccine status: Up to date  Covid-19 vaccine status: Completed vaccines  Qualifies for Shingles Vaccine? No  pt taking methotrexate  Screening Tests Health Maintenance  Topic Date Due  . MAMMOGRAM  08/29/2019  . COVID-19 Vaccine (3 - Booster) 06/18/2020  . INFLUENZA VACCINE  02/22/2021  . TETANUS/TDAP  10/21/2029  . COLONOSCOPY (Pts 45-57yr Insurance coverage will need to be confirmed)  12/09/2029  . Hepatitis C Screening  Completed  . PNA vac Low Risk Adult  Completed  . HPV VACCINES  Aged Out  . DEXA SCAN  Discontinued    Health Maintenance  Health Maintenance Due  Topic Date Due  . MAMMOGRAM  08/29/2019  . COVID-19 Vaccine (3 - Booster) 06/18/2020    Colorectal cancer screening: Type of screening: Colonoscopy. Completed 12/10/19. Repeat every 1 years  Mammogram status: Completed 08/28/18. Repeat every year   Bone Density status: Completed 2002. Results reflect: Bone density results: NORMAL. Repeat every 2 years.  Lung Cancer Screening: (Low Dose CT Chest recommended if Age 75-80years, 30 pack-year currently smoking OR have quit w/in 15years.) does not qualify.   Additional Screening:  Hepatitis C Screening: does qualify; Completed 11/01/16  Vision Screening: Recommended annual ophthalmology exams for early detection of glaucoma and other disorders of the eye. Is the patient up to date with their annual eye exam?  No  Who is the provider or what [facility] the [facility]   Dental Screening: Recommended annual dental exams for proper oral hygiene  Community Resource Referral / Chronic Care Management: CRR required this visit?  No   CCM required this visit?  No      Plan:      I have personally reviewed and noted the following in the patient's chart:   . Medical and social history . Use of alcohol, tobacco or illicit drugs  . Current medications and supplements . Functional ability and status . Nutritional status . Physical activity . Advanced directives . List of other physicians . Hospitalizations, surgeries, and ER visits in previous 12 months . Vitals . Screenings to include cognitive, depression, and falls . Referrals and appointments  In addition, I have reviewed and discussed with patient certain preventive protocols, quality metrics, and best practice recommendations. A written personalized care plan for preventive services as well as general preventive health recommendations were provided to patient.     KClemetine Marker LPN   [id number]  Nurse Notes: none

## 2022-01-31 DIAGNOSIS — J4520 Mild intermittent asthma, uncomplicated: Secondary | ICD-10-CM | POA: Insufficient documentation

## 2022-02-17 ENCOUNTER — Ambulatory Visit: Payer: Self-pay

## 2022-02-17 NOTE — Telephone Encounter (Signed)
Noted  Pt going to UC.  KP 

## 2022-02-17 NOTE — Telephone Encounter (Signed)
    Chief Complaint: Pt. Currently with daughter in [place]. Has cough, SOB with exertion x 1 month. Has been to UC x 2, taken 2 antibiotics and Prednisone. Asking for VV. Practice recommends pt. Have in person visit there for evaluation. Pt. Verbalizes understanding. Symptoms: Cough, SOB Frequency: 1 month ago Pertinent Negatives: Patient denies fever Disposition: [] ED /[x] Urgent Care (no appt availability in office) / [] Appointment(In office/virtual)/ []  [place] [facility]/ [] Home Care/ [] Refused Recommended Disposition /[] [place] [facility]/ []  Follow-up with PCP Additional Notes:   Reason for Disposition  Patient sounds very sick or weak to the triager  Answer Assessment - Initial Assessment Questions 1. RESPIRATORY STATUS: "Describe your breathing?" (e.g., wheezing, shortness of breath, unable to speak, severe coughing)      SOB, cough 2. ONSET: "When did this breathing problem begin?"      1 month ago 3. PATTERN "Does the difficult breathing come and go, or has it been constant since it started?"      Constant 4. SEVERITY: "How bad is your breathing?" (e.g., mild, moderate, severe)    - MILD: No SOB at rest, mild SOB with walking, speaks normally in sentences, can lie down, no retractions, pulse < 100.    - MODERATE: SOB at rest, SOB with minimal exertion and prefers to sit, cannot lie down flat, speaks in phrases, mild retractions, audible wheezing, pulse 100-120.    - SEVERE: Very SOB at rest, speaks in single words, struggling to breathe, sitting hunched forward, retractions, pulse > 120      Moderate 5. RECURRENT SYMPTOM: "Have you had difficulty breathing before?" If Yes, ask: "When was the last time?" and "What happened that time?"      Yes 6. CARDIAC HISTORY: "Do you have any history of heart disease?" (e.g., heart attack, angina, bypass surgery, angioplasty)      Yes 7. LUNG HISTORY: "Do you have any history of lung disease?"  (e.g., pulmonary embolus, asthma,  emphysema)     Yes - asthma 8. CAUSE: "What do you think is causing the breathing problem?"      Unsure 9. OTHER SYMPTOMS: "Do you have any other symptoms? (e.g., dizziness, runny nose, cough, chest pain, fever)     Cough 10. O2 SATURATION MONITOR:  "Do you use an oxygen saturation monitor (pulse oximeter) at home?" If Yes, ask: "What is your reading (oxygen level) today?" "What is your usual oxygen saturation reading?" (e.g., 95%)       No 11. PREGNANCY: "Is there any chance you are pregnant?" "When was your last menstrual period?"       No 12. TRAVEL: "Have you traveled out of the country in the last month?" (e.g., travel history, exposures)       No  Protocols used: Breathing Difficulty-A-AH

## 2022-10-13 DIAGNOSIS — M810 Age-related osteoporosis without current pathological fracture: Secondary | ICD-10-CM | POA: Insufficient documentation

## 2023-03-30 ENCOUNTER — Ambulatory Visit: Payer: Self-pay

## 2023-03-30 NOTE — Telephone Encounter (Signed)
  Chief Complaint: 30 lb weight gain - leg swelling. Symptoms: Above Frequency: ongoing Pertinent Negatives: Patient denies  Disposition: [] ED /[] Urgent Care (no appt availability in office) / [x] Appointment(In office/virtual)/ []  [place] [facility]/ [] Home Care/ [] Refused Recommended Disposition /[] [place] [facility]/ []  Follow-up with PCP Additional Notes: Pt is followed very closely by cardiologist. Pt was seen yesterday and spoke with them today. Pt has gained 30 lbs over the past month, and has swollen legs. Pt was advise by cardiologist to go to PCP today. Made first available appt.  For pt. Pt will continue to monitor weight and breathing. Please advise.    Reason for Disposition  Requesting regular office appointment  Answer Assessment - Initial Assessment Questions 1. REASON FOR CALL or QUESTION: "What is your reason for calling today?" or "How can I best help you?" or "What question do you have that I can help answer?"     Pt has had weight gain of 30 lbs and has swelling in her legs.  Protocols used: Information Only Call - No Triage-A-AH

## 2023-03-31 ENCOUNTER — Ambulatory Visit (INDEPENDENT_AMBULATORY_CARE_PROVIDER_SITE_OTHER): Payer: Medicare Other | Admitting: Internal Medicine

## 2023-03-31 ENCOUNTER — Encounter: Payer: Self-pay | Admitting: Internal Medicine

## 2023-03-31 VITALS — BP 112/60 | HR 77 | Ht 64.0 in | Wt 213.8 lb

## 2023-03-31 DIAGNOSIS — M0579 Rheumatoid arthritis with rheumatoid factor of multiple sites without organ or systems involvement: Secondary | ICD-10-CM

## 2023-03-31 DIAGNOSIS — I5020 Unspecified systolic (congestive) heart failure: Secondary | ICD-10-CM | POA: Diagnosis not present

## 2023-03-31 NOTE — Progress Notes (Signed)
Date:  03/31/2023   Name:  Hayley Ewing   DOB:  01/24/1946   MRN:  [id number]   Chief Complaint: Edema  HPI Weight gain - went from 190 to 215 over 2 days time. Seen by Cardiology - labs done;  potassium decreased and lasix decreased. She has been elevating her legs and the swelling is improving.  She still feels like she has abdominal bloating. She denies PND or orthopnea.  Lab Results  Component Value Date   NA 140 01/29/2020   K 5.0 01/29/2020   CO2 25 (A) 01/29/2020   GLUCOSE 82 12/18/2019   BUN 21 01/29/2020   CREATININE 1.1 01/29/2020   CALCIUM 10.1 01/29/2020   GFRNONAA 49 01/29/2020   Lab Results  Component Value Date   CHOL 218 (H) 10/22/2019   HDL 92 10/22/2019   LDLCALC 105 (H) 10/22/2019   TRIG 124 10/22/2019   CHOLHDL 2.4 10/22/2019   Lab Results  Component Value Date   TSH 0.448 (L) 10/22/2019   Lab Results  Component Value Date   HGBA1C 5.6 10/22/2019   Lab Results  Component Value Date   WBC 7.5 01/29/2020   HGB 12.0 01/29/2020   HCT 40 01/29/2020   MCV 79 12/18/2019   PLT 265 01/29/2020   Lab Results  Component Value Date   ALT 22 10/22/2019   AST 11 10/22/2019   ALKPHOS 58 10/22/2019   BILITOT 0.5 10/22/2019   No results found for: "25OHVITD2", "25OHVITD3", "VD25OH"   Review of Systems  Constitutional:  Positive for unexpected weight change. Negative for appetite change, fatigue and fever.  Respiratory:  Negative for chest tightness, shortness of breath and wheezing.   Cardiovascular:  Positive for leg swelling. Negative for chest pain and palpitations.  Neurological:  Negative for dizziness, light-headedness and headaches.  Psychiatric/Behavioral:  Negative for dysphoric mood and sleep disturbance. The patient is not nervous/anxious.     Patient Active Problem List   Diagnosis Date Noted   Age-related osteoporosis without current pathological fracture 10/13/2022   Mild intermittent asthma 01/31/2022   Acquired absence of both  cervix and uterus 03/03/2020   Long term (current) use of anticoagulants 01/23/2020   Chronic diastolic heart failure (HCC) 01/23/2020   Iron deficiency anemia 12/18/2019   Current chronic use of systemic steroids 11/15/2019   Generalized osteoarthritis 10/22/2019   Heart failure with reduced ejection fraction (HCC) 09/16/2019   Chondrocalcinosis 01/15/2018   Rheumatoid arthritis (HCC) 01/15/2018   Colitis 08/31/2017   Essential hypertension 08/10/2017   Displacement of lumbar intervertebral disc without myelopathy 01/30/2017   Venous insufficiency of both lower extremities 06/14/2016   Bladder cystocele 05/19/2015   S/P hip replacement, right 05/19/2015    No Known Allergies  Past Surgical History:  Procedure Laterality Date   REPLACEMENT TOTAL KNEE Right 07/28/2020   TOTAL ABDOMINAL HYSTERECTOMY  1998   TOTAL HIP ARTHROPLASTY Right 2014    Social History   Tobacco Use   Smoking status: Never   Smokeless tobacco: Never   Tobacco comments:    smoking cessation materials not required  Vaping Use   Vaping status: Never Used  Substance Use Topics   Alcohol use: Not Currently   Drug use: No     Medication list has been reviewed and updated.  Current Meds  Medication Sig   docusate sodium (COLACE) 100 MG capsule Colace 100 mg capsule  Take 1 capsule twice a day by oral route as needed.   DULoxetine (CYMBALTA) 60 MG  capsule Take 60 mg by mouth 2 (two) times daily.   ferrous sulfate 325 (65 FE) MG tablet Take 325 mg by mouth daily with breakfast.   folic acid (FOLVITE) 1 MG tablet    furosemide (LASIX) 40 MG tablet Take 40 mg by mouth.   lidocaine (LIDODERM) 5 % Place 1 patch onto the skin daily. Remove & Discard patch within 12 hours or as directed by MD   methotrexate 2.5 MG tablet    NARCAN 4 MG/0.1ML LIQD nasal spray kit SMARTSIG:1 Spray(s) Both Nares Once PRN   oxyCODONE-acetaminophen (PERCOCET) 10-325 MG tablet Take 1 tablet by mouth 4 (four) times daily as  needed.   XARELTO 10 MG TABS tablet Take 10 mg by mouth daily.   Current Facility-Administered Medications for the 03/31/23 encounter (Office Visit) with Reubin Milan, MD  Medication   albuterol (PROVENTIL) (2.5 MG/3ML) 0.083% nebulizer solution 2.5 mg       03/31/2023    1:34 PM 01/31/2020    3:16 PM 12/18/2019    2:49 PM 11/15/2019    8:36 AM  GAD 7 : Generalized Anxiety Score  Nervous, Anxious, on Edge 0 0 0 0  Control/stop worrying 0 0 0 0  Worry too much - different things 0 0 0 0  Trouble relaxing 0 1 0 0  Restless 0 0 0 0  Easily annoyed or irritable 0 0 0 0  Afraid - awful might happen 0 0 0 0  Total GAD 7 Score 0 1 0 0  Anxiety Difficulty Not difficult at all Not difficult at all Not difficult at all Not difficult at all       03/31/2023    1:34 PM 11/18/2020    9:45 AM 01/31/2020    3:15 PM  Depression screen PHQ 2/9  Decreased Interest 0 0 0  Down, Depressed, Hopeless 1 0 0  PHQ - 2 Score 1 0 0  Altered sleeping 3  1  Tired, decreased energy 3  2  Change in appetite 0  0  Feeling bad or failure about yourself  0  0  Trouble concentrating 0  0  Moving slowly or fidgety/restless 0  0  Suicidal thoughts 0  0  PHQ-9 Score 7  3  Difficult doing work/chores Not difficult at all      BP Readings from Last 3 Encounters:  03/31/23 112/60  03/13/20 120/71  01/31/20 (!) 80/50    Physical Exam Constitutional:      Appearance: She is obese.  Cardiovascular:     Rate and Rhythm: Normal rate and regular rhythm.     Pulses:          Dorsalis pedis pulses are 1+ on the right side and 1+ on the left side.     Heart sounds: Normal heart sounds.     Comments: Calfs soft, no cord or warmth/redness Pulmonary:     Effort: Pulmonary effort is normal.     Breath sounds: No wheezing or rhonchi.  Abdominal:     General: There is distension.     Palpations: Abdomen is soft.     Tenderness: There is no abdominal tenderness.  Musculoskeletal:        General: Swelling  present.     Cervical back: Normal range of motion.     Right lower leg: 1+ Pitting Edema present.     Left lower leg: 1+ Pitting Edema present.  Lymphadenopathy:     Cervical: No cervical adenopathy.  Skin:  General: Skin is warm and dry.     Findings: Erythema (of both forefeet and bilateral toes) present.  Neurological:     General: No focal deficit present.     Mental Status: She is alert.     Gait: Gait abnormal.     Wt Readings from Last 3 Encounters:  03/31/23 213 lb 12.8 oz (97 kg)  03/13/20 172 lb (78 kg)  01/31/20 178 lb (80.7 kg)    BP 112/60   Pulse 77   Ht 5\' 4"  (1.626 m)   Wt 213 lb 12.8 oz (97 kg)   SpO2 98%   BMI 36.70 kg/m   Assessment and Plan:  Problem List Items Addressed This Visit       Unprioritized   Rheumatoid arthritis (HCC)   Heart failure with reduced ejection fraction (HCC) - Primary    Recent weight gain for unclear reasons Seen by Cardiology 2 days ago - K was slightly high so potassium supplement was stopped as well as lasix. BNP was normal @ 224. Plan was for ECHO and maybe stress testing - appointment on 9/11 Was instructed to come here for further evaluation. No evidence of DVT - she remains on Xarelto daily Does not appear to be cellulitis - esp since bilateral so suspect this is just mild erythema from swelling Continue elevation, reduce sodium intake Continue lasix 40 mg - take potassium every other day. Keep appointment for ECHO and stress testing       No follow-ups on file.    Reubin Milan, MD [facility] [place]

## 2023-03-31 NOTE — Telephone Encounter (Signed)
Called pt scheduled an appointment for today 03/31/23.  KP

## 2023-03-31 NOTE — Assessment & Plan Note (Addendum)
Recent weight gain for unclear reasons Seen by Cardiology 2 days ago - K was slightly high so potassium supplement was stopped as well as lasix. BNP was normal @ 224. Plan was for ECHO and maybe stress testing - appointment on 9/11 Was instructed to come here for further evaluation. No evidence of DVT - she remains on Xarelto daily Does not appear to be cellulitis - esp since bilateral so suspect this is just mild erythema from swelling Continue elevation, reduce sodium intake Continue lasix 40 mg - take potassium every other day. Keep appointment for ECHO and stress testing

## 2023-04-03 ENCOUNTER — Ambulatory Visit: Payer: Medicare Other | Admitting: Internal Medicine

## 2023-04-19 ENCOUNTER — Encounter: Payer: Self-pay | Admitting: Internal Medicine

## 2023-04-19 ENCOUNTER — Ambulatory Visit (INDEPENDENT_AMBULATORY_CARE_PROVIDER_SITE_OTHER): Payer: Medicare Other | Admitting: Internal Medicine

## 2023-04-19 ENCOUNTER — Ambulatory Visit: Payer: Self-pay | Admitting: *Deleted

## 2023-04-19 VITALS — BP 122/74 | HR 84 | Temp 98.3°F | Ht 64.0 in | Wt 201.0 lb

## 2023-04-19 DIAGNOSIS — J069 Acute upper respiratory infection, unspecified: Secondary | ICD-10-CM

## 2023-04-19 DIAGNOSIS — I5020 Unspecified systolic (congestive) heart failure: Secondary | ICD-10-CM

## 2023-04-19 DIAGNOSIS — R051 Acute cough: Secondary | ICD-10-CM

## 2023-04-19 MED ORDER — PROMETHAZINE-DM 6.25-15 MG/5ML PO SYRP
5.0000 mL | ORAL_SOLUTION | Freq: Four times a day (QID) | ORAL | 0 refills | Status: AC | PRN
Start: 2023-04-19 — End: 2023-04-28

## 2023-04-19 MED ORDER — AZITHROMYCIN 250 MG PO TABS
ORAL_TABLET | ORAL | 0 refills | Status: AC
Start: 2023-04-19 — End: 2023-04-24

## 2023-04-19 NOTE — Progress Notes (Signed)
Date:  04/19/2023   Name:  Hayley Ewing   DOB:  1945/11/08   MRN:  [id number]   Chief Complaint: Cough (Started 2 days ago. Cough- yellow/ green in color. No fever or shortness of breath. )  URI  This is a new problem. The current episode started yesterday. There has been no fever. Associated symptoms include congestion and coughing. Pertinent negatives include no chest pain, ear pain, headaches, joint swelling, sore throat, vomiting or wheezing. Associated symptoms comments: hoarseness. She has tried nothing for the symptoms.    Lab Results  Component Value Date   NA 140 01/29/2020   K 5.0 01/29/2020   CO2 25 (A) 01/29/2020   GLUCOSE 82 12/18/2019   BUN 21 01/29/2020   CREATININE 1.1 01/29/2020   CALCIUM 10.1 01/29/2020   GFRNONAA 49 01/29/2020   Lab Results  Component Value Date   CHOL 218 (H) 10/22/2019   HDL 92 10/22/2019   LDLCALC 105 (H) 10/22/2019   TRIG 124 10/22/2019   CHOLHDL 2.4 10/22/2019   Lab Results  Component Value Date   TSH 0.448 (L) 10/22/2019   Lab Results  Component Value Date   HGBA1C 5.6 10/22/2019   Lab Results  Component Value Date   WBC 7.5 01/29/2020   HGB 12.0 01/29/2020   HCT 40 01/29/2020   MCV 79 12/18/2019   PLT 265 01/29/2020   Lab Results  Component Value Date   ALT 22 10/22/2019   AST 11 10/22/2019   ALKPHOS 58 10/22/2019   BILITOT 0.5 10/22/2019   No results found for: "25OHVITD2", "25OHVITD3", "VD25OH"   Review of Systems  Constitutional:  Negative for chills, fatigue and fever.  HENT:  Positive for congestion and voice change. Negative for ear pain, sinus pressure and sore throat.   Respiratory:  Positive for cough. Negative for chest tightness, shortness of breath and wheezing.   Cardiovascular:  Negative for chest pain.  Gastrointestinal:  Negative for vomiting.  Neurological:  Negative for headaches.    Patient Active Problem List   Diagnosis Date Noted   Age-related osteoporosis without current  pathological fracture 10/13/2022   Mild intermittent asthma 01/31/2022   Acquired absence of both cervix and uterus 03/03/2020   Long term (current) use of anticoagulants 01/23/2020   Chronic diastolic heart failure (HCC) 01/23/2020   Iron deficiency anemia 12/18/2019   Current chronic use of systemic steroids 11/15/2019   Generalized osteoarthritis 10/22/2019   Heart failure with reduced ejection fraction (HCC) 09/16/2019   Chondrocalcinosis 01/15/2018   Rheumatoid arthritis (HCC) 01/15/2018   Colitis 08/31/2017   Essential hypertension 08/10/2017   Displacement of lumbar intervertebral disc without myelopathy 01/30/2017   Venous insufficiency of both lower extremities 06/14/2016   Bladder cystocele 05/19/2015   S/P hip replacement, right 05/19/2015    No Known Allergies  Past Surgical History:  Procedure Laterality Date   REPLACEMENT TOTAL KNEE Right 07/28/2020   TOTAL ABDOMINAL HYSTERECTOMY  1998   TOTAL HIP ARTHROPLASTY Right 2014    Social History   Tobacco Use   Smoking status: Never   Smokeless tobacco: Never   Tobacco comments:    smoking cessation materials not required  Vaping Use   Vaping status: Never Used  Substance Use Topics   Alcohol use: Not Currently   Drug use: No     Medication list has been reviewed and updated.  Current Meds  Medication Sig   azithromycin (ZITHROMAX Z-PAK) 250 MG tablet UAD   docusate sodium (COLACE) 100  MG capsule Colace 100 mg capsule  Take 1 capsule twice a day by oral route as needed.   DULoxetine (CYMBALTA) 60 MG capsule Take 60 mg by mouth 2 (two) times daily.   ferrous sulfate 325 (65 FE) MG tablet Take 325 mg by mouth daily with breakfast.   folic acid (FOLVITE) 1 MG tablet    furosemide (LASIX) 40 MG tablet Take 40 mg by mouth.   lidocaine (LIDODERM) 5 % Place 1 patch onto the skin daily. Remove & Discard patch within 12 hours or as directed by MD   methotrexate 2.5 MG tablet    NARCAN 4 MG/0.1ML LIQD nasal spray  kit SMARTSIG:1 Spray(s) Both Nares Once PRN   oxyCODONE-acetaminophen (PERCOCET) 10-325 MG tablet Take 1 tablet by mouth 4 (four) times daily as needed.   promethazine-dextromethorphan (PROMETHAZINE-DM) 6.25-15 MG/5ML syrup Take 5 mLs by mouth 4 (four) times daily as needed for up to 9 days for cough.   XARELTO 10 MG TABS tablet Take 10 mg by mouth daily.   Current Facility-Administered Medications for the 04/19/23 encounter (Office Visit) with Reubin Milan, MD  Medication   albuterol (PROVENTIL) (2.5 MG/3ML) 0.083% nebulizer solution 2.5 mg       03/31/2023    1:34 PM 01/31/2020    3:16 PM 12/18/2019    2:49 PM 11/15/2019    8:36 AM  GAD 7 : Generalized Anxiety Score  Nervous, Anxious, on Edge 0 0 0 0  Control/stop worrying 0 0 0 0  Worry too much - different things 0 0 0 0  Trouble relaxing 0 1 0 0  Restless 0 0 0 0  Easily annoyed or irritable 0 0 0 0  Afraid - awful might happen 0 0 0 0  Total GAD 7 Score 0 1 0 0  Anxiety Difficulty Not difficult at all Not difficult at all Not difficult at all Not difficult at all       03/31/2023    1:34 PM 11/18/2020    9:45 AM 01/31/2020    3:15 PM  Depression screen PHQ 2/9  Decreased Interest 0 0 0  Down, Depressed, Hopeless 1 0 0  PHQ - 2 Score 1 0 0  Altered sleeping 3  1  Tired, decreased energy 3  2  Change in appetite 0  0  Feeling bad or failure about yourself  0  0  Trouble concentrating 0  0  Moving slowly or fidgety/restless 0  0  Suicidal thoughts 0  0  PHQ-9 Score 7  3  Difficult doing work/chores Not difficult at all      BP Readings from Last 3 Encounters:  04/19/23 122/74  03/31/23 112/60  03/13/20 120/71    Physical Exam Vitals and nursing note reviewed.  Constitutional:      General: She is not in acute distress.    Appearance: Normal appearance. She is well-developed.  HENT:     Head: Normocephalic and atraumatic.     Right Ear: Tympanic membrane normal.     Left Ear: Tympanic membrane normal.      Nose:     Right Sinus: No maxillary sinus tenderness or frontal sinus tenderness.     Left Sinus: No maxillary sinus tenderness or frontal sinus tenderness.     Mouth/Throat:     Pharynx: Oropharynx is clear.     Comments: Hoarse raspy voice Cardiovascular:     Rate and Rhythm: Normal rate and regular rhythm.  Pulmonary:     Effort:  Pulmonary effort is normal. No respiratory distress.     Breath sounds: Normal breath sounds. No wheezing.  Lymphadenopathy:     Cervical: No cervical adenopathy.  Skin:    General: Skin is warm and dry.     Findings: No rash.  Neurological:     Mental Status: She is alert and oriented to person, place, and time.  Psychiatric:        Mood and Affect: Mood normal.        Behavior: Behavior normal.     Wt Readings from Last 3 Encounters:  04/19/23 201 lb (91.2 kg)  03/31/23 213 lb 12.8 oz (97 kg)  03/13/20 172 lb (78 kg)    BP 122/74   Pulse 84   Temp 98.3 F (36.8 C) (Oral)   Ht 5\' 4"  (1.626 m)   Wt 201 lb (91.2 kg)   SpO2 96%   BMI 34.50 kg/m   Assessment and Plan:  Problem List Items Addressed This Visit       Unprioritized   Heart failure with reduced ejection fraction (HCC)    Stable per cardiology Weight is down compared to last visit and recent ECHO unchanged      Other Visit Diagnoses     Upper respiratory tract infection, unspecified type    -  Primary   continue fluids and rest treat with Zpak, Mucinex bid   Relevant Medications   azithromycin (ZITHROMAX Z-PAK) 250 MG tablet   Acute cough       Relevant Medications   promethazine-dextromethorphan (PROMETHAZINE-DM) 6.25-15 MG/5ML syrup       No follow-ups on file.    Reubin Milan, MD [facility] [place]

## 2023-04-19 NOTE — Telephone Encounter (Signed)
  Chief Complaint: hoarseness , wheezing , coughing  Symptoms: see above  Frequency: yesterday  Pertinent Negatives: Patient denies chest pain no difficulty breathing  Disposition: [] ED /[] Urgent Care (no appt availability in office) / [x] Appointment(In office/virtual)/ []  [place] [facility]/ [] Home Care/ [] Refused Recommended Disposition /[]  Mobile Bus/ []  Follow-up with PCP Additional Notes:   Appt this am      Reason for Disposition  [1] MILD difficulty breathing (e.g., minimal/no SOB at rest, SOB with walking, pulse <100) AND [2] NEW-onset or WORSE than normal  Answer Assessment - Initial Assessment Questions 1. RESPIRATORY STATUS: "Describe your breathing?" (e.g., wheezing, shortness of breath, unable to speak, severe coughing)      Wheezing  2. ONSET: "When did this breathing problem begin?"      Yesterday  3. PATTERN "Does the difficult breathing come and go, or has it been constant since it started?"      Na  4. SEVERITY: "How bad is your breathing?" (e.g., mild, moderate, severe)    - MILD: No SOB at rest, mild SOB with walking, speaks normally in sentences, can lie down, no retractions, pulse < 100.    - MODERATE: SOB at rest, SOB with minimal exertion and prefers to sit, cannot lie down flat, speaks in phrases, mild retractions, audible wheezing, pulse 100-120.    - SEVERE: Very SOB at rest, speaks in single words, struggling to breathe, sitting hunched forward, retractions, pulse > 120      Wheezing , with hoarseness 5. RECURRENT SYMPTOM: "Have you had difficulty breathing before?" If Yes, ask: "When was the last time?" and "What happened that time?"      Na  6. CARDIAC HISTORY: "Do you have any history of heart disease?" (e.g., heart attack, angina, bypass surgery, angioplasty)      na 7. LUNG HISTORY: "Do you have any history of lung disease?"  (e.g., pulmonary embolus, asthma, emphysema)     na 8. CAUSE: "What do you think is causing the breathing  problem?"      Not sure  9. OTHER SYMPTOMS: "Do you have any other symptoms? (e.g., dizziness, runny nose, cough, chest pain, fever)     Hoarseness, wheezing  10. O2 SATURATION MONITOR:  "Do you use an oxygen saturation monitor (pulse oximeter) at home?" If Yes, ask: "What is your reading (oxygen level) today?" "What is your usual oxygen saturation reading?" (e.g., 95%)       na 11. PREGNANCY: "Is there any chance you are pregnant?" "When was your last menstrual period?"       na 12. TRAVEL: "Have you traveled out of the country in the last month?" (e.g., travel history, exposures)       na  Protocols used: Breathing Difficulty-A-AH

## 2023-04-19 NOTE — Assessment & Plan Note (Signed)
Stable per cardiology Weight is down compared to last visit and recent ECHO unchanged

## 2023-04-19 NOTE — Patient Instructions (Signed)
Plain Mucinex twice a day to loose phlegm.

## 2023-10-02 ENCOUNTER — Ambulatory Visit (INDEPENDENT_AMBULATORY_CARE_PROVIDER_SITE_OTHER): Payer: Medicare Other | Admitting: Internal Medicine

## 2023-10-02 ENCOUNTER — Encounter: Payer: Self-pay | Admitting: Internal Medicine

## 2023-10-02 VITALS — BP 126/72 | HR 79 | Ht 64.0 in | Wt 195.4 lb

## 2023-10-02 DIAGNOSIS — F411 Generalized anxiety disorder: Secondary | ICD-10-CM | POA: Diagnosis not present

## 2023-10-02 DIAGNOSIS — M0579 Rheumatoid arthritis with rheumatoid factor of multiple sites without organ or systems involvement: Secondary | ICD-10-CM | POA: Diagnosis not present

## 2023-10-02 DIAGNOSIS — I5020 Unspecified systolic (congestive) heart failure: Secondary | ICD-10-CM | POA: Diagnosis not present

## 2023-10-02 MED ORDER — BUSPIRONE HCL 10 MG PO TABS: 10.0000 mg | ORAL_TABLET | Freq: Two times a day (BID) | ORAL | 1 refills | Status: DC

## 2023-10-02 NOTE — Assessment & Plan Note (Signed)
 Gradual onset recently - triggered by dental work, disturbing news reports, etc No SI/HI.  On Cymbalta 60 mg bid. Will add Buspar 10 mg bid  Recheck in 6 weeks

## 2023-10-02 NOTE — Assessment & Plan Note (Addendum)
 Followed by Rheumatology. Maintained on MTX

## 2023-10-02 NOTE — Assessment & Plan Note (Signed)
 Stable and followed by Cardiology On lasix as needed.

## 2023-10-02 NOTE — Progress Notes (Signed)
 Date:  10/02/2023   Name:  Hayley Ewing   DOB:  1946/02/06   MRN:  [id number]   Chief Complaint: Anxiety (Patient said she has been experiencing some anxiety and mood changes recently)  Anxiety Presents for initial visit. Onset was at an unknown time. The problem has been gradually worsening. Symptoms include excessive worry, irritability and nervous/anxious behavior. Patient reports no chest pain, dizziness, palpitations or shortness of breath.   Past treatments include nothing.  She takes Cymbalta 60 mg bid for pain.  Review of Systems  Constitutional:  Positive for irritability. Negative for fatigue and unexpected weight change.  HENT:  Negative for trouble swallowing.   Eyes:  Negative for visual disturbance.  Respiratory:  Negative for cough, chest tightness, shortness of breath and wheezing.   Cardiovascular:  Negative for chest pain, palpitations and leg swelling.  Gastrointestinal:  Negative for abdominal pain, constipation and diarrhea.  Musculoskeletal:  Negative for arthralgias and myalgias.  Neurological:  Negative for dizziness, weakness, light-headedness and headaches.  Psychiatric/Behavioral:  The patient is nervous/anxious.      Lab Results  Component Value Date   NA 140 01/29/2020   K 5.0 01/29/2020   CO2 25 (A) 01/29/2020   GLUCOSE 82 12/18/2019   BUN 21 01/29/2020   CREATININE 1.1 01/29/2020   CALCIUM 10.1 01/29/2020   GFRNONAA 49 01/29/2020   Lab Results  Component Value Date   CHOL 218 (H) 10/22/2019   HDL 92 10/22/2019   LDLCALC 105 (H) 10/22/2019   TRIG 124 10/22/2019   CHOLHDL 2.4 10/22/2019   Lab Results  Component Value Date   TSH 0.448 (L) 10/22/2019   Lab Results  Component Value Date   HGBA1C 5.6 10/22/2019   Lab Results  Component Value Date   WBC 7.5 01/29/2020   HGB 12.0 01/29/2020   HCT 40 01/29/2020   MCV 79 12/18/2019   PLT 265 01/29/2020   Lab Results  Component Value Date   ALT 22 10/22/2019   AST 11 10/22/2019    ALKPHOS 58 10/22/2019   BILITOT 0.5 10/22/2019   No results found for: "25OHVITD2", "25OHVITD3", "VD25OH"   Patient Active Problem List   Diagnosis Date Noted   Generalized anxiety disorder 10/02/2023   Age-related osteoporosis without current pathological fracture 10/13/2022   Mild intermittent asthma 01/31/2022   Acquired absence of both cervix and uterus 03/03/2020   Long term (current) use of anticoagulants 01/23/2020   Chronic diastolic heart failure (HCC) 01/23/2020   Iron deficiency anemia 12/18/2019   Current chronic use of systemic steroids 11/15/2019   Generalized osteoarthritis 10/22/2019   Heart failure with reduced ejection fraction (HCC) 09/16/2019   Chondrocalcinosis 01/15/2018   Rheumatoid arthritis involving multiple sites with positive rheumatoid factor (HCC) 01/15/2018   Colitis 08/31/2017   Essential hypertension 08/10/2017   Displacement of lumbar intervertebral disc without myelopathy 01/30/2017   Venous insufficiency of both lower extremities 06/14/2016   Bladder cystocele 05/19/2015   S/P hip replacement, right 05/19/2015    No Known Allergies  Past Surgical History:  Procedure Laterality Date   REPLACEMENT TOTAL KNEE Right 07/28/2020   TOTAL ABDOMINAL HYSTERECTOMY  1998   TOTAL HIP ARTHROPLASTY Right 2014    Social History   Tobacco Use   Smoking status: Never   Smokeless tobacco: Never   Tobacco comments:    smoking cessation materials not required  Vaping Use   Vaping status: Never Used  Substance Use Topics   Alcohol use: Not Currently  Drug use: No     Medication list has been reviewed and updated.  Current Meds  Medication Sig   busPIRone (BUSPAR) 10 MG tablet Take 1 tablet (10 mg total) by mouth 2 (two) times daily.   docusate sodium (COLACE) 100 MG capsule Colace 100 mg capsule  Take 1 capsule twice a day by oral route as needed.   DULoxetine (CYMBALTA) 60 MG capsule Take 60 mg by mouth 2 (two) times daily.   ferrous  sulfate 325 (65 FE) MG tablet Take 325 mg by mouth daily with breakfast.   folic acid (FOLVITE) 1 MG tablet    furosemide (LASIX) 40 MG tablet Take 40 mg by mouth.   lidocaine (LIDODERM) 5 % Place 1 patch onto the skin daily. Remove & Discard patch within 12 hours or as directed by MD   methotrexate 2.5 MG tablet    NARCAN 4 MG/0.1ML LIQD nasal spray kit SMARTSIG:1 Spray(s) Both Nares Once PRN   oxyCODONE-acetaminophen (PERCOCET) 10-325 MG tablet Take 1 tablet by mouth 4 (four) times daily as needed.   XARELTO 10 MG TABS tablet Take 10 mg by mouth daily.   Current Facility-Administered Medications for the 10/02/23 encounter (Office Visit) with Reubin Milan, MD  Medication   albuterol (PROVENTIL) (2.5 MG/3ML) 0.083% nebulizer solution 2.5 mg       10/02/2023    3:26 PM 03/31/2023    1:34 PM 01/31/2020    3:16 PM 12/18/2019    2:49 PM  GAD 7 : Generalized Anxiety Score  Nervous, Anxious, on Edge 3 0 0 0  Control/stop worrying 3 0 0 0  Worry too much - different things 3 0 0 0  Trouble relaxing 3 0 1 0  Restless 3 0 0 0  Easily annoyed or irritable 3 0 0 0  Afraid - awful might happen 0 0 0 0  Total GAD 7 Score 18 0 1 0  Anxiety Difficulty Somewhat difficult Not difficult at all Not difficult at all Not difficult at all       10/02/2023    3:26 PM 03/31/2023    1:34 PM 11/18/2020    9:45 AM  Depression screen PHQ 2/9  Decreased Interest 1 0 0  Down, Depressed, Hopeless  1 0  PHQ - 2 Score 1 1 0  Altered sleeping 3 3   Tired, decreased energy 3 3   Change in appetite 3 0   Feeling bad or failure about yourself  0 0   Trouble concentrating 3 0   Moving slowly or fidgety/restless 3 0   Suicidal thoughts 0 0   PHQ-9 Score 16 7   Difficult doing work/chores Somewhat difficult Not difficult at all     BP Readings from Last 3 Encounters:  10/02/23 126/72  04/19/23 122/74  03/31/23 112/60    Physical Exam Vitals and nursing note reviewed.  Constitutional:      General: She  is not in acute distress.    Appearance: Normal appearance. She is well-developed.  HENT:     Head: Normocephalic and atraumatic.  Cardiovascular:     Rate and Rhythm: Normal rate and regular rhythm.     Heart sounds: No murmur heard. Pulmonary:     Effort: Pulmonary effort is normal. No respiratory distress.     Breath sounds: No wheezing or rhonchi.  Musculoskeletal:     Cervical back: Normal range of motion.     Right lower leg: No edema.     Left lower  leg: No edema.  Lymphadenopathy:     Cervical: No cervical adenopathy.  Skin:    General: Skin is warm and dry.     Findings: No rash.  Neurological:     Mental Status: She is alert and oriented to person, place, and time.  Psychiatric:        Mood and Affect: Mood normal.        Behavior: Behavior normal.     Wt Readings from Last 3 Encounters:  10/02/23 195 lb 6 oz (88.6 kg)  04/19/23 201 lb (91.2 kg)  03/31/23 213 lb 12.8 oz (97 kg)    BP 126/72   Pulse 79   Ht 5\' 4"  (1.626 m)   Wt 195 lb 6 oz (88.6 kg)   SpO2 95%   BMI 33.54 kg/m   Assessment and Plan:  Problem List Items Addressed This Visit       Unprioritized   Heart failure with reduced ejection fraction (HCC)   Stable and followed by Cardiology On lasix as needed.      Rheumatoid arthritis involving multiple sites with positive rheumatoid factor (HCC)   Followed by Rheumatology. Maintained on MTX      Generalized anxiety disorder - Primary   Gradual onset recently - triggered by dental work, disturbing news reports, etc No SI/HI.  On Cymbalta 60 mg bid. Will add Buspar 10 mg bid  Recheck in 6 weeks      Relevant Medications   busPIRone (BUSPAR) 10 MG tablet    Return in about 6 weeks (around 11/13/2023), or anxiety.    Reubin Milan, MD [facility] [place]

## 2023-10-26 ENCOUNTER — Ambulatory Visit: Payer: Self-pay

## 2023-10-26 NOTE — Telephone Encounter (Signed)
 Please review. Can you see this patient?  KP

## 2023-10-26 NOTE — Telephone Encounter (Signed)
 Patient called back-stated she didn't go to the ED to be evaluated. Patient states paramedics stated she sounded worse on the phone. Patient calling to make an appointment. Explained to patient that an appointment couldn't be made by Nurse Triage based on what was recommended and that a message would be sent to her provider. Patient is asking for PCP staff to call her back to make an appointment to see Dr. Judithann Graves. [phone]

## 2023-10-26 NOTE — Telephone Encounter (Signed)
 Chief Complaint: SOB, cough Symptoms: severe SOB, cough, wheezing Frequency: x 2 weeks Pertinent Negatives: Patient denies chest pain, fever Disposition: [x] ED /[] Urgent Care (no appt availability in office) / [] Appointment(In office/virtual)/ []  [place] [facility]/ [] Home Care/ [] Refused Recommended Disposition /[] [place] [facility]/ []  Follow-up with PCP Additional Notes: Patient calling in for triage, sounds like struggling and can't speak more than a few words without catching her breath. Patient states it has been this severe for 3 days. Audible wheezing and coughing noted. Patient states the SOB is severe when she tries to talk. She states she thought this started as allergies about 2 weeks ago and has worsened. Patient states she did not want to go to ED. Phone call disconnected while triaging, called patient back and was able to advise patient for triage RN to call 911. Patient agreeable to have rescue come and go to ED.  Copied from CRM [id number]. Topic: Clinical - Red Word Triage >> Oct 26, 2023 12:04 PM Yolanda T wrote: Red Word that prompted transfer to Nurse Triage: patient stated she has bronchitis and is short of breath Reason for Disposition  SEVERE difficulty breathing (e.g., struggling for each breath, speaks in single words)  Answer Assessment - Initial Assessment Questions 1. RESPIRATORY STATUS: "Describe your breathing?" (e.g., wheezing, shortness of breath, unable to speak, severe coughing)      Shortness of breath and severe coughing. Wheezing.  2. ONSET: "When did this breathing problem begin?"      X 3 days.  3. PATTERN "Does the difficult breathing come and go, or has it been constant since it started?"      Constant, worse when talking.  4. SEVERITY: "How bad is your breathing?" (e.g., mild, moderate, severe)    - MILD: No SOB at rest, mild SOB with walking, speaks normally in sentences, can lie down, no retractions, pulse < 100.    - MODERATE: SOB at  rest, SOB with minimal exertion and prefers to sit, cannot lie down flat, speaks in phrases, mild retractions, audible wheezing, pulse 100-120.    - SEVERE: Very SOB at rest, speaks in single words, struggling to breathe, sitting hunched forward, retractions, pulse > 120      Moderate to severe.  5. RECURRENT SYMPTOM: "Have you had difficulty breathing before?" If Yes, ask: "When was the last time?" and "What happened that time?"      Yes, she states she has to sleep with 3 pillows at night.  6. CARDIAC HISTORY: "Do you have any history of heart disease?" (e.g., heart attack, angina, bypass surgery, angioplasty)      Yes, congestive heart failure and hypertension.  7. LUNG HISTORY: "Do you have any history of lung disease?"  (e.g., pulmonary embolus, asthma, emphysema)     Asthma. She states she has not had her inhaler refilled.  8. CAUSE: "What do you think is causing the breathing problem?"      She states at first she thought it was allergies and then was unsure if hit was bronchitis.  9. OTHER SYMPTOMS: "Do you have any other symptoms? (e.g., dizziness, runny nose, cough, chest pain, fever)     Denies.  10. O2 SATURATION MONITOR:  "Do you use an oxygen saturation monitor (pulse oximeter) at home?" If Yes, ask: "What is your reading (oxygen level) today?" "What is your usual oxygen saturation reading?" (e.g., 95%)       Yes.  11. PREGNANCY: "Is there any chance you are pregnant?" "When was your last  menstrual period?"       N/A.  12. TRAVEL: "Have you traveled out of the country in the last month?" (e.g., travel history, exposures)       Denies.  Protocols used: Breathing Difficulty-A-AH

## 2023-10-26 NOTE — Telephone Encounter (Signed)
 Called pt could not leave VM. VM is full . Please read message to pt if she calls back per Jesusita Oka.Marland Kitchen  KP

## 2023-10-26 NOTE — Telephone Encounter (Signed)
 Noted  KP

## 2023-11-13 ENCOUNTER — Encounter: Payer: Self-pay | Admitting: Internal Medicine

## 2023-11-13 ENCOUNTER — Ambulatory Visit (INDEPENDENT_AMBULATORY_CARE_PROVIDER_SITE_OTHER): Admitting: Internal Medicine

## 2023-11-13 VITALS — BP 118/76 | HR 68 | Ht 64.0 in | Wt 199.4 lb

## 2023-11-13 DIAGNOSIS — F411 Generalized anxiety disorder: Secondary | ICD-10-CM

## 2023-11-13 DIAGNOSIS — J40 Bronchitis, not specified as acute or chronic: Secondary | ICD-10-CM | POA: Diagnosis not present

## 2023-11-13 MED ORDER — ALBUTEROL SULFATE HFA 108 (90 BASE) MCG/ACT IN AERS: 2.0000 | INHALATION_SPRAY | Freq: Four times a day (QID) | RESPIRATORY_TRACT | 0 refills | Status: DC | PRN

## 2023-11-13 MED ORDER — BUSPIRONE HCL 10 MG PO TABS: 10.0000 mg | ORAL_TABLET | Freq: Two times a day (BID) | ORAL | 1 refills | Status: DC

## 2023-11-13 NOTE — Progress Notes (Signed)
 Date:  11/13/2023   Name:  Hayley Ewing   DOB:  29-Jun-1946   MRN:  [id number]   Chief Complaint: Anxiety  Anxiety Presents for follow-up visit. Symptoms include shortness of breath. Patient reports no chest pain or palpitations. Symptoms occur occasionally. The quality of sleep is good.   Compliance with medications is 76-100%.  Shortness of Breath This is a new (since the episode of bronchitis last month that she self treated with some old antibiotics.) problem. The problem occurs daily. The problem has been unchanged. Pertinent negatives include no chest pain, fever or wheezing.    Review of Systems  Constitutional:  Positive for fatigue. Negative for chills and fever.  Respiratory:  Positive for shortness of breath. Negative for chest tightness and wheezing.   Cardiovascular:  Negative for chest pain and palpitations.     Lab Results  Component Value Date   NA 140 01/29/2020   K 5.0 01/29/2020   CO2 25 (A) 01/29/2020   GLUCOSE 82 12/18/2019   BUN 21 01/29/2020   CREATININE 1.1 01/29/2020   CALCIUM 10.1 01/29/2020   GFRNONAA 49 01/29/2020   Lab Results  Component Value Date   CHOL 218 (H) 10/22/2019   HDL 92 10/22/2019   LDLCALC 105 (H) 10/22/2019   TRIG 124 10/22/2019   CHOLHDL 2.4 10/22/2019   Lab Results  Component Value Date   TSH 0.448 (L) 10/22/2019   Lab Results  Component Value Date   HGBA1C 5.6 10/22/2019   Lab Results  Component Value Date   WBC 7.5 01/29/2020   HGB 12.0 01/29/2020   HCT 40 01/29/2020   MCV 79 12/18/2019   PLT 265 01/29/2020   Lab Results  Component Value Date   ALT 22 10/22/2019   AST 11 10/22/2019   ALKPHOS 58 10/22/2019   BILITOT 0.5 10/22/2019   No results found for: "25OHVITD2", "25OHVITD3", "VD25OH"   Patient Active Problem List   Diagnosis Date Noted   Generalized anxiety disorder 10/02/2023   Age-related osteoporosis without current pathological fracture 10/13/2022   Mild intermittent asthma 01/31/2022    Acquired absence of both cervix and uterus 03/03/2020   Long term (current) use of anticoagulants 01/23/2020   Chronic diastolic heart failure (HCC) 01/23/2020   Iron deficiency anemia 12/18/2019   Current chronic use of systemic steroids 11/15/2019   Generalized osteoarthritis 10/22/2019   Heart failure with reduced ejection fraction (HCC) 09/16/2019   Chondrocalcinosis 01/15/2018   Rheumatoid arthritis involving multiple sites with positive rheumatoid factor (HCC) 01/15/2018   Colitis 08/31/2017   Essential hypertension 08/10/2017   Displacement of lumbar intervertebral disc without myelopathy 01/30/2017   Venous insufficiency of both lower extremities 06/14/2016   Bladder cystocele 05/19/2015   S/P hip replacement, right 05/19/2015    No Known Allergies  Past Surgical History:  Procedure Laterality Date   REPLACEMENT TOTAL KNEE Right 07/28/2020   TOTAL ABDOMINAL HYSTERECTOMY  1998   TOTAL HIP ARTHROPLASTY Right 2014    Social History   Tobacco Use   Smoking status: Never   Smokeless tobacco: Never   Tobacco comments:    smoking cessation materials not required  Vaping Use   Vaping status: Never Used  Substance Use Topics   Alcohol use: Not Currently   Drug use: No     Medication list has been reviewed and updated.  Current Meds  Medication Sig   albuterol  (VENTOLIN  HFA) 108 (90 Base) MCG/ACT inhaler Inhale 2 puffs into the lungs every 6 (six) hours  as needed for wheezing or shortness of breath.   docusate sodium (COLACE) 100 MG capsule Colace 100 mg capsule  Take 1 capsule twice a day by oral route as needed.   DULoxetine (CYMBALTA) 60 MG capsule Take 60 mg by mouth 2 (two) times daily.   ferrous sulfate 325 (65 FE) MG tablet Take 325 mg by mouth daily with breakfast.   folic acid (FOLVITE) 1 MG tablet    furosemide  (LASIX ) 40 MG tablet Take 40 mg by mouth.   lidocaine  (LIDODERM ) 5 % Place 1 patch onto the skin daily. Remove & Discard patch within 12 hours or as  directed by MD   methotrexate 2.5 MG tablet    NARCAN 4 MG/0.1ML LIQD nasal spray kit SMARTSIG:1 Spray(s) Both Nares Once PRN   oxyCODONE-acetaminophen  (PERCOCET) 10-325 MG tablet Take 1 tablet by mouth 4 (four) times daily as needed.   XARELTO 10 MG TABS tablet Take 10 mg by mouth daily.   [DISCONTINUED] busPIRone  (BUSPAR ) 10 MG tablet Take 1 tablet (10 mg total) by mouth 2 (two) times daily.   Current Facility-Administered Medications for the 11/13/23 encounter (Office Visit) with Sheron Dixons, MD  Medication   albuterol  (PROVENTIL ) (2.5 MG/3ML) 0.083% nebulizer solution 2.5 mg       11/13/2023    2:53 PM 10/02/2023    3:26 PM 03/31/2023    1:34 PM 01/31/2020    3:16 PM  GAD 7 : Generalized Anxiety Score  Nervous, Anxious, on Edge 0 3 0 0  Control/stop worrying 0 3 0 0  Worry too much - different things 1 3 0 0  Trouble relaxing 3 3 0 1  Restless 3 3 0 0  Easily annoyed or irritable 0 3 0 0  Afraid - awful might happen 0 0 0 0  Total GAD 7 Score 7 18 0 1  Anxiety Difficulty Somewhat difficult Somewhat difficult Not difficult at all Not difficult at all       11/13/2023    2:52 PM 10/02/2023    3:26 PM 03/31/2023    1:34 PM  Depression screen PHQ 2/9  Decreased Interest 1 1 0  Down, Depressed, Hopeless 0  1  PHQ - 2 Score 1 1 1   Altered sleeping 1 3 3   Tired, decreased energy 3 3 3   Change in appetite 2 3 0  Feeling bad or failure about yourself  0 0 0  Trouble concentrating 0 3 0  Moving slowly or fidgety/restless 0 3 0  Suicidal thoughts 0 0 0  PHQ-9 Score 7 16 7   Difficult doing work/chores Somewhat difficult Somewhat difficult Not difficult at all    BP Readings from Last 3 Encounters:  11/13/23 118/76  10/02/23 126/72  04/19/23 122/74    Physical Exam Vitals and nursing note reviewed.  Constitutional:      General: She is not in acute distress.    Appearance: Normal appearance. She is well-developed.  HENT:     Head: Normocephalic and atraumatic.   Cardiovascular:     Rate and Rhythm: Normal rate and regular rhythm.  Pulmonary:     Effort: Pulmonary effort is normal. No respiratory distress.     Breath sounds: No wheezing or rhonchi.  Musculoskeletal:     Cervical back: Normal range of motion.     Right lower leg: No edema.     Left lower leg: No edema.  Lymphadenopathy:     Cervical: No cervical adenopathy.  Skin:    General: Skin  is warm and dry.     Findings: No rash.  Neurological:     General: No focal deficit present.     Mental Status: She is alert and oriented to person, place, and time.  Psychiatric:        Mood and Affect: Mood normal.        Behavior: Behavior normal.     Wt Readings from Last 3 Encounters:  11/13/23 199 lb 6 oz (90.4 kg)  10/02/23 195 lb 6 oz (88.6 kg)  04/19/23 201 lb (91.2 kg)    BP 118/76   Pulse 68   Ht 5\' 4"  (1.626 m)   Wt 199 lb 6 oz (90.4 kg)   SpO2 95%   BMI 34.22 kg/m   Assessment and Plan:  Problem List Items Addressed This Visit       Unprioritized   Generalized anxiety disorder - Primary   Improved anxiety symptoms since adding Buspar . "It's like a miracle drug." She continues on Cymbalta as well 60 mg bid.       Relevant Medications   busPIRone  (BUSPAR ) 10 MG tablet   Other Visit Diagnoses       Bronchitis       recently self treated with old antibiotics and is improved but will some mild residual wheezing at time and chest tightness Exam normal - will refill Albuterol    Relevant Medications   albuterol  (VENTOLIN  HFA) 108 (90 Base) MCG/ACT inhaler       No follow-ups on file.    Sheron Dixons, MD [facility] [place]

## 2023-11-13 NOTE — Patient Instructions (Signed)
Call [facility] to schedule your Mammogram at [phone].

## 2023-11-13 NOTE — Assessment & Plan Note (Addendum)
 Improved anxiety symptoms since adding Buspar . "It's like a miracle drug." She continues on Cymbalta as well 60 mg bid.

## 2023-12-20 LAB — HM MAMMOGRAPHY

## 2023-12-21 ENCOUNTER — Encounter: Payer: Self-pay | Admitting: Internal Medicine

## 2023-12-21 ENCOUNTER — Ambulatory Visit: Admitting: Emergency Medicine

## 2023-12-21 VITALS — Ht 64.0 in | Wt 196.0 lb

## 2023-12-21 DIAGNOSIS — Z0000 Encounter for general adult medical examination without abnormal findings: Secondary | ICD-10-CM | POA: Diagnosis not present

## 2023-12-21 NOTE — Patient Instructions (Signed)
 Hayley Ewing , Thank you for taking time out of your busy schedule to complete your Annual Wellness Visit with me. I enjoyed our conversation and look forward to speaking with you again next year. I, as well as your care team,  appreciate your ongoing commitment to your health goals. Please review the following plan we discussed and let me know if I can assist you in the future. Your Game plan/ To Do List    Referrals: None  Follow up Visits: Next Medicare AWV with our clinical staff: 01/02/25 @ 1:50pm   Have you seen your provider in the last 6 months (3 months if uncontrolled diabetes)? Yes Next Office Visit with your provider: 06/12/24 @ 1:20pm with Dr. Gala Jubilee  Clinician Recommendations: Get the Shingrix (shingles) vaccines at your local pharmacy.  Aim for 30 minutes of exercise or brisk walking, 6-8 glasses of water, and 5 servings of fruits and vegetables each day.       This is a list of the screening recommended for you and due dates:  Health Maintenance  Topic Date Due   Zoster (Shingles) Vaccine (1 of 2) Never done   COVID-19 Vaccine (3 - Mixed Product risk series) 01/14/2020   Flu Shot  02/23/2024   Mammogram  12/19/2024   Medicare Annual Wellness Visit  12/20/2024   DTaP/Tdap/Td vaccine (3 - Td or Tdap) 10/21/2029   Pneumonia Vaccine  Completed   Hepatitis C Screening  Completed   HPV Vaccine  Aged Out   Meningitis B Vaccine  Aged Out   DEXA scan (bone density measurement)  Discontinued   Colon Cancer Screening  Discontinued    Advanced directives: (Declined) Advance directive discussed with you today. Even though you declined this today, please call our office should you change your mind, and we can give you the proper paperwork for you to fill out. Advance Care Planning is important because it:  [x]  Makes sure you receive the medical care that is consistent with your values, goals, and preferences  [x]  It provides guidance to your family and loved ones and reduces  their decisional burden about whether or not they are making the right decisions based on your wishes.  Follow the link provided in your after visit summary or read over the paperwork we have mailed to you to help you started getting your Advance Directives in place. If you need assistance in completing these, please reach out to us  so that we can help you!  See attachments for Preventive Care and Fall Prevention Tips.   Fall Prevention in the Home, Adult Falls can cause injuries and affect people of all ages. There are many simple things that you can do to make your home safe and to help prevent falls. If you need it, ask for help making these changes. What actions can I take to prevent falls? General information Use good lighting in all rooms. Make sure to: Replace any light bulbs that burn out. Turn on lights if it is dark and use night-lights. Keep items that you use often in easy-to-reach places. Lower the shelves around your home if needed. Move furniture so that there are clear paths around it. Do not keep throw rugs or other things on the floor that can make you trip. If any of your floors are uneven, fix them. Add color or contrast paint or tape to clearly mark and help you see: Grab bars or handrails. First and last steps of staircases. Where the edge of each step is.  If you use a ladder or stepladder: Make sure that it is fully opened. Do not climb a closed ladder. Make sure the sides of the ladder are locked in place. Have someone hold the ladder while you use it. Know where your pets are as you move through your home. What can I do in the bathroom?     Keep the floor dry. Clean up any water that is on the floor right away. Remove soap buildup in the bathtub or shower. Buildup makes bathtubs and showers slippery. Use non-skid mats or decals on the floor of the bathtub or shower. Attach bath mats securely with double-sided, non-slip rug tape. If you need to sit down  while you are in the shower, use a non-slip stool. Install grab bars by the toilet and in the bathtub and shower. Do not use towel bars as grab bars. What can I do in the bedroom? Make sure that you have a light by your bed that is easy to reach. Do not use any sheets or blankets on your bed that hang to the floor. Have a firm bench or chair with side arms that you can use for support when you get dressed. What can I do in the kitchen? Clean up any spills right away. If you need to reach something above you, use a sturdy step stool that has a grab bar. Keep electrical cables out of the way. Do not use floor polish or wax that makes floors slippery. What can I do with my stairs? Do not leave anything on the stairs. Make sure that you have a light switch at the top and the bottom of the stairs. Have them installed if you do not have them. Make sure that there are handrails on both sides of the stairs. Fix handrails that are broken or loose. Make sure that handrails are as long as the staircases. Install non-slip stair treads on all stairs in your home if they do not have carpet. Avoid having throw rugs at the top or bottom of stairs, or secure the rugs with carpet tape to prevent them from moving. Choose a carpet design that does not hide the edge of steps on the stairs. Make sure that carpet is firmly attached to the stairs. Fix any carpet that is loose or worn. What can I do on the outside of my home? Use bright outdoor lighting. Repair the edges of walkways and driveways and fix any cracks. Clear paths of anything that can make you trip, such as tools or rocks. Add color or contrast paint or tape to clearly mark and help you see high doorway thresholds. Trim any bushes or trees on the main path into your home. Check that handrails are securely fastened and in good repair. Both sides of all steps should have handrails. Install guardrails along the edges of any raised decks or porches. Have  leaves, snow, and ice cleared regularly. Use sand, salt, or ice melt on walkways during winter months if you live where there is ice and snow. In the garage, clean up any spills right away, including grease or oil spills. What other actions can I take? Review your medicines with your health care provider. Some medicines can make you confused or feel dizzy. This can increase your chance of falling. Wear closed-toe shoes that fit well and support your feet. Wear shoes that have rubber soles and low heels. Use a cane, walker, scooter, or crutches that help you move around if needed. Talk with  your provider about other ways that you can decrease your risk of falls. This may include seeing a physical therapist to learn to do exercises to improve movement and strength. Where to find more information Centers for Disease Control and Prevention, [employer]: TonerPromos.no [employer] on Aging: BaseRingTones.pl National Institute on Aging: BaseRingTones.pl Contact a health care provider if: You are afraid of falling at home. You feel weak, drowsy, or dizzy at home. You fall at home. Get help right away if you: Lose consciousness or have trouble moving after a fall. Have a fall that causes a head injury. These symptoms may be an emergency. Get help right away. Call 911. Do not wait to see if the symptoms will go away. Do not drive yourself to the hospital. This information is not intended to replace advice given to you by your health care provider. Make sure you discuss any questions you have with your health care provider. Document Revised: 03/14/2022 Document Reviewed: 03/14/2022 Elsevier Patient Education  2024 [employer].

## 2023-12-21 NOTE — Progress Notes (Signed)
 Subjective:   Hayley Ewing is a 78 y.o. who presents for a Medicare Wellness preventive visit.  As a reminder, Annual Wellness Visits don't include a physical exam, and some assessments may be limited, especially if this visit is performed virtually. We may recommend an in-person follow-up visit with your provider if needed.  Visit Complete: Virtual I connected with  Hayley Ewing on 12/21/23 by a audio enabled telemedicine application and verified that I am speaking with the correct person using two identifiers.  Patient Location: Home  Provider Location: Home Office  I discussed the limitations of evaluation and management by telemedicine. The patient expressed understanding and agreed to proceed.  Vital Signs: Because this visit was a virtual/telehealth visit, some criteria may be missing or patient reported. Any vitals not documented were not able to be obtained and vitals that have been documented are patient reported.  VideoDeclined- This patient declined Librarian, academic. Therefore the visit was completed with audio only.  Persons Participating in Visit: Patient.  AWV Questionnaire: No: Patient Medicare AWV questionnaire was not completed prior to this visit.  Cardiac Risk Factors include: advanced age (>72men, >9 women);hypertension;obesity (BMI >30kg/m2)     Objective:     Today's Vitals   12/21/23 1434  Weight: 196 lb (88.9 kg)  Height: 5\' 4"  (1.626 m)  PainSc: 0-No pain   Body mass index is 33.64 kg/m.     12/21/2023    2:49 PM 11/18/2020    9:47 AM 11/18/2019   10:17 AM 11/09/2017    8:50 AM 11/01/2016   10:36 AM 11/16/2015    4:11 PM  Advanced Directives  Does Patient Have a Medical Advance Directive? No No No No No No  Would patient like information on creating a medical advance directive? No - Patient declined No - Patient declined No - Patient declined   No - patient declined information    Current Medications  (verified) Outpatient Encounter Medications as of 12/21/2023  Medication Sig   albuterol  (VENTOLIN  HFA) 108 (90 Base) MCG/ACT inhaler Inhale 2 puffs into the lungs every 6 (six) hours as needed for wheezing or shortness of breath.   busPIRone  (BUSPAR ) 10 MG tablet Take 1 tablet (10 mg total) by mouth 2 (two) times daily.   docusate sodium (COLACE) 100 MG capsule Colace 100 mg capsule  Take 1 capsule twice a day by oral route as needed.   DULoxetine (CYMBALTA) 60 MG capsule Take 60 mg by mouth 2 (two) times daily.   folic acid (FOLVITE) 1 MG tablet    furosemide  (LASIX ) 40 MG tablet Take 40 mg by mouth.   JARDIANCE 10 MG TABS tablet Take 10 mg by mouth daily.   lidocaine  (LIDODERM ) 5 % Place 1 patch onto the skin daily. Remove & Discard patch within 12 hours or as directed by MD   losartan (COZAAR) 25 MG tablet Take 25 mg by mouth daily.   methotrexate 2.5 MG tablet    metoprolol  succinate (TOPROL -XL) 25 MG 24 hr tablet Take 12.5 mg by mouth daily.   NARCAN 4 MG/0.1ML LIQD nasal spray kit SMARTSIG:1 Spray(s) Both Nares Once PRN   oxyCODONE-acetaminophen  (PERCOCET) 10-325 MG tablet Take 1 tablet by mouth 4 (four) times daily as needed.   XARELTO 10 MG TABS tablet Take 10 mg by mouth daily.   ferrous sulfate 325 (65 FE) MG tablet Take 325 mg by mouth daily with breakfast. (Patient not taking: Reported on 12/21/2023)   Facility-Administered Encounter Medications as of  12/21/2023  Medication   albuterol  (PROVENTIL ) (2.5 MG/3ML) 0.083% nebulizer solution 2.5 mg    Allergies (verified) Patient has no known allergies.   History: Past Medical History:  Diagnosis Date   Acute deep vein thrombosis (DVT) of distal vein of both lower extremities (HCC) 12/09/2019   Asthma    Bursitis    Cardiomegaly    Female cystocele    Hypertension    Osteoarthritis    Sinusitis    Venous insufficiency    Past Surgical History:  Procedure Laterality Date   REPLACEMENT TOTAL KNEE Right 07/28/2020   TOTAL  ABDOMINAL HYSTERECTOMY  1998   TOTAL HIP ARTHROPLASTY Right 2014   Family History  Problem Relation Age of Onset   Brain cancer Mother    Brain cancer Father    Social History   Socioeconomic History   Marital status: Married    Spouse name: Siegfried Dress   Number of children: 3   Years of education: some college   Highest education level: 12th grade  Occupational History   Occupation: Retired  Tobacco Use   Smoking status: Never    Passive exposure: Past   Smokeless tobacco: Never   Tobacco comments:    smoking cessation materials not required  Vaping Use   Vaping status: Never Used  Substance and Sexual Activity   Alcohol use: Not Currently   Drug use: No   Sexual activity: Not Currently  Other Topics Concern   Not on file  Social History Narrative   12/21/23  2 living children, 1 deceased at 52 mths old   Social Drivers of Corporate investment banker Strain: Low Risk  (12/21/2023)   Overall Financial Resource Strain (CARDIA)    Difficulty of Paying Living Expenses: Not hard at all  Food Insecurity: No Food Insecurity (12/21/2023)   Hunger Vital Sign    Worried About Running Out of Food in the Last Year: Never true    Ran Out of Food in the Last Year: Never true  Transportation Needs: No Transportation Needs (12/21/2023)   PRAPARE - Administrator, Civil Service (Medical): No    Lack of Transportation (Non-Medical): No  Physical Activity: Sufficiently Active (12/21/2023)   Exercise Vital Sign    Days of Exercise per Week: 7 days    Minutes of Exercise per Session: 60 min  Stress: No Stress Concern Present (12/21/2023)   [employer] of Occupational Health - Occupational Stress Questionnaire    Feeling of Stress : Not at all  Social Connections: Moderately Isolated (12/21/2023)   Social Connection and Isolation Panel [NHANES]    Frequency of Communication with Friends and Family: More than three times a week    Frequency of Social Gatherings with Friends and  Family: More than three times a week    Attends Religious Services: Never    Database administrator or Organizations: No    Attends Engineer, structural: Never    Marital Status: Married    Tobacco Counseling Counseling given: No Tobacco comments: smoking cessation materials not required    Clinical Intake:  Pre-visit preparation completed: Yes  Pain : 0-10 Pain Score: 0-No pain     BMI - recorded: 33.64 Nutritional Status: BMI > 30  Obese Nutritional Risks: None Diabetes: No  Lab Results  Component Value Date   HGBA1C 5.6 10/22/2019     How often do you need to have someone help you when you read instructions, pamphlets, or other written materials from  your doctor or pharmacy?: 1 - Never  Interpreter Needed?: No  Information entered by :: Jaunita Messier, CMA   Activities of Daily Living     12/21/2023    2:37 PM  In your present state of health, do you have any difficulty performing the following activities:  Hearing? 0  Vision? 0  Difficulty concentrating or making decisions? 0  Walking or climbing stairs? 1  Comment uses cane  Dressing or bathing? 0  Doing errands, shopping? 0  Preparing Food and eating ? N  Using the Toilet? N  In the past six months, have you accidently leaked urine? N  Do you have problems with loss of bowel control? N  Managing your Medications? N  Managing your Finances? N  Housekeeping or managing your Housekeeping? N    Patient Care Team: Sheron Dixons, MD as PCP - General (Internal Medicine) Blazing, Asenath Blacker, MD as Referring Physician (Cardiology) Derrell Flight, MD as Referring Physician (Rheumatology) Bowen, Thaddeus Filippo, PA-C (Pain Medicine) Ladonna Pickup, MD as Referring Physician (Orthopedic Surgery)  Indicate any recent Medical Services you may have received from other than [facility] providers in the past year (date may be approximate).     Assessment:    This is a routine wellness examination for  Hayley Ewing.  Hearing/Vision screen Hearing Screening - Comments:: Denies hearing loss Vision Screening - Comments:: Needs a routine eye exam, last ~4 years. Patient will call to schedule appointment once she finishes dental treatment.   Goals Addressed               This Visit's Progress     Weight (lb) < 170 lb (77.1 kg) (pt-stated)   196 lb (88.9 kg)      Depression Screen     12/21/2023    2:47 PM 11/13/2023    2:52 PM 10/02/2023    3:26 PM 03/31/2023    1:34 PM 11/18/2020    9:45 AM 01/31/2020    3:15 PM 12/18/2019    2:49 PM  PHQ 2/9 Scores  PHQ - 2 Score 0 1 1 1  0 0 2  PHQ- 9 Score 1 7 16 7  3 2     Fall Risk     12/21/2023    2:51 PM 11/13/2023    2:52 PM 10/02/2023    3:25 PM 03/31/2023    1:34 PM 11/18/2020    9:48 AM  Fall Risk   Falls in the past year? 1 0 0 0 1  Number falls in past yr: 0 0 0 0 1  Injury with Fall? 1 0 0 0 1  Risk for fall due to : History of fall(s);Impaired balance/gait;Orthopedic patient No Fall Risks No Fall Risks No Fall Risks History of fall(s);Impaired balance/gait;Impaired mobility  Follow up Falls evaluation completed;Education provided Falls evaluation completed Falls evaluation completed Falls evaluation completed Falls prevention discussed    MEDICARE RISK AT HOME:  Medicare Risk at Home Any stairs in or around the home?: Yes If so, are there any without handrails?: No Home free of loose throw rugs in walkways, pet beds, electrical cords, etc?: Yes Adequate lighting in your home to reduce risk of falls?: Yes Life alert?: No Use of a cane, walker or w/c?: Yes (uses cane) Grab bars in the bathroom?: Yes Shower chair or bench in shower?: Yes Elevated toilet seat or a handicapped toilet?: Yes  TIMED UP AND GO:  Was the test performed?  No  Cognitive Function: 6CIT completed  12/21/2023    2:52 PM 11/09/2017    9:08 AM 11/01/2016   10:42 AM  6CIT Screen  What Year? 0 points 0 points 0 points  What month? 0 points 3 points 0  points  What time? 0 points 0 points 0 points  Count back from 20 0 points 0 points 0 points  Months in reverse 0 points 4 points 4 points  Repeat phrase 0 points 0 points 0 points  Total Score 0 points 7 points 4 points    Immunizations Immunization History  Administered Date(s) Administered   Pneumococcal Conjugate-13 11/01/2016   Pneumococcal Polysaccharide-23 11/09/2017   Tdap 04/30/2008, 10/22/2019   Unspecified SARS-COV-2 Vaccination 11/18/2019, 12/17/2019    Screening Tests Health Maintenance  Topic Date Due   Zoster Vaccines- Shingrix (1 of 2) Never done   COVID-19 Vaccine (3 - Mixed Product risk series) 01/14/2020   INFLUENZA VACCINE  02/23/2024   MAMMOGRAM  12/19/2024   Medicare Annual Wellness (AWV)  12/20/2024   DTaP/Tdap/Td (3 - Td or Tdap) 10/21/2029   Pneumonia Vaccine 8+ Years old  Completed   Hepatitis C Screening  Completed   HPV VACCINES  Aged Out   Meningococcal B Vaccine  Aged Out   DEXA SCAN  Discontinued   Colonoscopy  Discontinued    Health Maintenance  Health Maintenance Due  Topic Date Due   Zoster Vaccines- Shingrix (1 of 2) Never done   COVID-19 Vaccine (3 - Mixed Product risk series) 01/14/2020   Health Maintenance Items Addressed: See Nurse Notes  Additional Screening:  Vision Screening: Recommended annual ophthalmology exams for early detection of glaucoma and other disorders of the eye.  Dental Screening: Recommended annual dental exams for proper oral hygiene  Community Resource Referral / Chronic Care Management: CRR required this visit?  No   CCM required this visit?  No   Plan:    I have personally reviewed and noted the following in the patient's chart:   Medical and social history Use of alcohol, tobacco or illicit drugs  Current medications and supplements including opioid prescriptions. Patient is not currently taking opioid prescriptions. Functional ability and status Nutritional status Physical  activity Advanced directives List of other physicians Hospitalizations, surgeries, and ER visits in previous 12 months Vitals Screenings to include cognitive, depression, and falls Referrals and appointments  In addition, I have reviewed and discussed with patient certain preventive protocols, quality metrics, and best practice recommendations. A written personalized care plan for preventive services as well as general preventive health recommendations were provided to patient.   Jaunita Messier, CMA   12/21/2023   After Visit Summary: (MyChart) Due to this being a telephonic visit, the after visit summary with patients personalized plan was offered to patient via MyChart   Notes: Please refer to Routing Comments.

## 2024-02-05 ENCOUNTER — Ambulatory Visit: Payer: Self-pay

## 2024-02-05 NOTE — Telephone Encounter (Signed)
 FYI Only or Action Required?: FYI only for provider.  Patient was last seen in primary care on 11/13/2023 by Justus Leita DEL, MD.  Called Nurse Triage reporting Mass.  Symptoms began several months ago.  Interventions attempted: Rest, hydration, or home remedies.  Symptoms are: gradually worsening.  Triage Disposition: See Physician Within 24 Hours  Patient/caregiver understands and will follow disposition?: Yes, will follow disposition  Copied from CRM [id number]. Topic: Clinical - Red Word Triage >> Feb 05, 2024 11:35 AM Hayley Ewing wrote: Red Word that prompted transfer to Nurse Triage: Found a large egg size cyst yesterday on left side of belly under fat. Started to hurt on that side for the last 6 weeks. Reason for Disposition  [1] Swelling is painful to touch AND [2] no fever  Answer Assessment - Initial Assessment Questions 1. APPEARANCE of SWELLING: What does it look like?     Pain when palpated or when using abd muscles to move or go to the bathroom 2. SIZE: How large is the swelling? (e.g., inches, cm; or compare to size of pinhead, tip of pen, eraser, coin, pea, grape, ping pong ball)      About the size of a lg chx egg 3. LOCATION: Where is the swelling located?     L lower abd 4. ONSET: When did the swelling start?     About 4 months started hurting, states yesterday she noticed the mass area 5. COLOR: What color is it? Is there more than one color?     No discoloration 6. PAIN: Is there any pain? If Yes, ask: How bad is the pain? (Scale 1-10; or mild, moderate, severe)       States takes hydrocodone  q 4-6 hours so she feels that is controlling the pain, moderate 7. ITCH: Does it itch? If Yes, ask: How bad is the itch?      denies 8. CAUSE: What do you think caused the swelling?     unsure 9 OTHER SYMPTOMS: Do you have any other symptoms? (e.g., fever)     denies  Protocols used: Skin Lump or Localized Swelling-A-AH

## 2024-02-06 ENCOUNTER — Encounter: Payer: Self-pay | Admitting: Internal Medicine

## 2024-02-06 ENCOUNTER — Ambulatory Visit (INDEPENDENT_AMBULATORY_CARE_PROVIDER_SITE_OTHER): Admitting: Internal Medicine

## 2024-02-06 VITALS — BP 122/78 | HR 91 | Ht 64.0 in | Wt 191.0 lb

## 2024-02-06 DIAGNOSIS — F411 Generalized anxiety disorder: Secondary | ICD-10-CM

## 2024-02-06 DIAGNOSIS — R1032 Left lower quadrant pain: Secondary | ICD-10-CM | POA: Diagnosis not present

## 2024-02-06 NOTE — Progress Notes (Signed)
 Date:  02/06/2024   Name:  Hayley Ewing   DOB:  [birth year]   MRN:  [id number]   Chief Complaint: Flank Pain (Left flank pain X 6 weeks on and off. Hurts when standing up from a sitting position, and when bending over. Patient palpated a egg size cyst under skin yesterday. )  Abdominal Pain This is a new problem. The current episode started 1 to 4 weeks ago. The onset quality is undetermined. The problem occurs constantly. The problem has been unchanged (noticed a mass yesterday). The quality of the pain is a sensation of fullness and dull. The abdominal pain does not radiate. Associated symptoms include arthralgias. Pertinent negatives include no constipation, diarrhea, headaches or myalgias.  Anxiety Presents for follow-up visit. Patient reports no chest pain, dizziness, nervous/anxious behavior, palpitations or shortness of breath. Symptoms occur occasionally. The severity of symptoms is mild. The quality of sleep is good.   Compliance with medications is 76-100%.    Review of Systems  Constitutional:  Negative for fatigue and unexpected weight change.  HENT:  Negative for trouble swallowing.   Eyes:  Negative for visual disturbance.  Respiratory:  Negative for cough, chest tightness, shortness of breath and wheezing.   Cardiovascular:  Negative for chest pain, palpitations and leg swelling.  Gastrointestinal:  Positive for abdominal pain. Negative for constipation and diarrhea.  Musculoskeletal:  Positive for arthralgias and gait problem. Negative for myalgias.  Neurological:  Negative for dizziness, weakness, light-headedness and headaches.  Psychiatric/Behavioral:  Negative for dysphoric mood and sleep disturbance. The patient is not nervous/anxious.      Lab Results  Component Value Date   NA 140 01/29/2020   K 5.0 01/29/2020   CO2 25 (A) 01/29/2020   GLUCOSE 82 12/18/2019   BUN 21 01/29/2020   CREATININE 1.1 01/29/2020   CALCIUM 10.1 01/29/2020   GFRNONAA 49 01/29/2020    Lab Results  Component Value Date   CHOL 218 (H) 10/22/2019   HDL 92 10/22/2019   LDLCALC 105 (H) 10/22/2019   TRIG 124 10/22/2019   CHOLHDL 2.4 10/22/2019   Lab Results  Component Value Date   TSH 0.448 (L) 10/22/2019   Lab Results  Component Value Date   HGBA1C 5.6 10/22/2019   Lab Results  Component Value Date   WBC 7.5 01/29/2020   HGB 12.0 01/29/2020   HCT 40 01/29/2020   MCV 79 12/18/2019   PLT 265 01/29/2020   Lab Results  Component Value Date   ALT 22 10/22/2019   AST 11 10/22/2019   ALKPHOS 58 10/22/2019   BILITOT 0.5 10/22/2019   No results found for: MARIEN BOLLS, VD25OH   Patient Active Problem List   Diagnosis Date Noted   Generalized anxiety disorder 10/02/2023   Age-related osteoporosis without current pathological fracture 10/13/2022   Mild intermittent asthma 01/31/2022   Acquired absence of both cervix and uterus 03/03/2020   Long term (current) use of anticoagulants 01/23/2020   Chronic diastolic heart failure (HCC) 01/23/2020   Iron deficiency anemia 12/18/2019   Current chronic use of systemic steroids 11/15/2019   Generalized osteoarthritis 10/22/2019   Heart failure with reduced ejection fraction (HCC) 09/16/2019   Chondrocalcinosis 01/15/2018   Rheumatoid arthritis involving multiple sites with positive rheumatoid factor (HCC) 01/15/2018   Colitis 08/31/2017   Essential hypertension 08/10/2017   Displacement of lumbar intervertebral disc without myelopathy 01/30/2017   Venous insufficiency of both lower extremities 06/14/2016   Bladder cystocele 05/19/2015   S/P hip replacement, right  05/19/2015    No Known Allergies  Past Surgical History:  Procedure Laterality Date   REPLACEMENT TOTAL KNEE Right 07/28/2020   TOTAL ABDOMINAL HYSTERECTOMY  1998   TOTAL HIP ARTHROPLASTY Right 2014    Social History   Tobacco Use   Smoking status: Never    Passive exposure: Past   Smokeless tobacco: Never   Tobacco comments:     smoking cessation materials not required  Vaping Use   Vaping status: Never Used  Substance Use Topics   Alcohol use: Not Currently   Drug use: No     Medication list has been reviewed and updated.  Current Meds  Medication Sig   albuterol  (VENTOLIN  HFA) 108 (90 Base) MCG/ACT inhaler Inhale 2 puffs into the lungs every 6 (six) hours as needed for wheezing or shortness of breath.   busPIRone  (BUSPAR ) 10 MG tablet Take 1 tablet (10 mg total) by mouth 2 (two) times daily.   docusate sodium (COLACE) 100 MG capsule Colace 100 mg capsule  Take 1 capsule twice a day by oral route as needed.   DULoxetine (CYMBALTA) 60 MG capsule Take 60 mg by mouth 2 (two) times daily.   ferrous sulfate 325 (65 FE) MG tablet Take 325 mg by mouth daily with breakfast.   folic acid (FOLVITE) 1 MG tablet    furosemide  (LASIX ) 40 MG tablet Take 40 mg by mouth.   JARDIANCE 10 MG TABS tablet Take 10 mg by mouth daily.   lidocaine  (LIDODERM ) 5 % Place 1 patch onto the skin daily. Remove & Discard patch within 12 hours or as directed by MD   losartan (COZAAR) 25 MG tablet Take 25 mg by mouth daily.   methotrexate 2.5 MG tablet    metoprolol  succinate (TOPROL -XL) 25 MG 24 hr tablet Take 12.5 mg by mouth daily.   NARCAN 4 MG/0.1ML LIQD nasal spray kit SMARTSIG:1 Spray(s) Both Nares Once PRN   oxyCODONE-acetaminophen  (PERCOCET) 10-325 MG tablet Take 1 tablet by mouth 4 (four) times daily as needed.   XARELTO 10 MG TABS tablet Take 10 mg by mouth daily.   Current Facility-Administered Medications for the 02/06/24 encounter (Office Visit) with Justus Leita DEL, MD  Medication   albuterol  (PROVENTIL ) (2.5 MG/3ML) 0.083% nebulizer solution 2.5 mg       02/06/2024    8:48 AM 11/13/2023    2:53 PM 10/02/2023    3:26 PM 03/31/2023    1:34 PM  GAD 7 : Generalized Anxiety Score  Nervous, Anxious, on Edge 0 0 3 0  Control/stop worrying 0 0 3 0  Worry too much - different things 0 1 3 0  Trouble relaxing 0 3 3 0   Restless 0 3 3 0  Easily annoyed or irritable 0 0 3 0  Afraid - awful might happen 0 0 0 0  Total GAD 7 Score 0 7 18 0  Anxiety Difficulty Not difficult at all Somewhat difficult Somewhat difficult Not difficult at all       02/06/2024    8:47 AM 12/21/2023    2:47 PM 11/13/2023    2:52 PM  Depression screen PHQ 2/9  Decreased Interest 0 0 1  Down, Depressed, Hopeless 0 0 0  PHQ - 2 Score 0 0 1  Altered sleeping 0 0 1  Tired, decreased energy 0 1 3  Change in appetite 0 0 2  Feeling bad or failure about yourself  0 0 0  Trouble concentrating 0 0 0  Moving slowly  or fidgety/restless 0 0 0  Suicidal thoughts 0 0 0  PHQ-9 Score 0 1 7  Difficult doing work/chores Not difficult at all Not difficult at all Somewhat difficult    BP Readings from Last 3 Encounters:  02/06/24 122/78  11/13/23 118/76  10/02/23 126/72    Physical Exam Vitals and nursing note reviewed.  Constitutional:      General: She is not in acute distress.    Appearance: She is well-developed.  HENT:     Head: Normocephalic and atraumatic.  Cardiovascular:     Rate and Rhythm: Normal rate and regular rhythm.  Pulmonary:     Effort: Pulmonary effort is normal. No respiratory distress.     Breath sounds: No wheezing or rhonchi.  Abdominal:     Palpations: Abdomen is soft.     Tenderness: There is abdominal tenderness in the left lower quadrant.      Comments: Area of fullness when standing Unable to appreciate a mass when supine  Skin:    General: Skin is warm and dry.     Findings: No rash.  Neurological:     Mental Status: She is alert and oriented to person, place, and time.  Psychiatric:        Mood and Affect: Mood normal.        Behavior: Behavior normal.     Wt Readings from Last 3 Encounters:  02/06/24 191 lb (86.6 kg)  12/21/23 196 lb (88.9 kg)  11/13/23 199 lb 6 oz (90.4 kg)    BP 122/78   Pulse 91   Ht 5' 4 (1.626 m)   Wt 191 lb (86.6 kg)   SpO2 98%   BMI 32.79 kg/m    Assessment and Plan:  Problem List Items Addressed This Visit       Unprioritized   Generalized anxiety disorder   Clinically stable on Cymbalta and Buspar .   No SI or HI on evaluation. Plan to continue same medications for now.       Other Visit Diagnoses       Left lower quadrant abdominal pain    -  Primary   concern for hernia - will refer to Gen Surgery in [facility]   Relevant Orders   Ambulatory referral to General Surgery       No follow-ups on file.    Leita HILARIO Adie, MD [facility] [place]

## 2024-02-06 NOTE — Assessment & Plan Note (Signed)
 Clinically stable on Cymbalta and Buspar .   No SI or HI on evaluation. Plan to continue same medications for now.

## 2024-02-14 ENCOUNTER — Ambulatory Visit: Payer: Self-pay

## 2024-02-14 NOTE — Telephone Encounter (Signed)
 FYI Only or Action Required?: Action required by provider: request for documentation or forms.  Patient was last seen in primary care on 02/06/2024 by Justus Leita DEL, MD.  Called Nurse Triage reporting Abdominal Pain.  Symptoms began a week ago.  Interventions attempted: Nothing.  Symptoms are: gradually worsening.  Triage Disposition: Go to ED Now (Notify PCP)  Patient/caregiver understands and will follow disposition?: Unsure  Copied from CRM [id number]. Topic: Clinical - Red Word Triage >> Feb 14, 2024  3:55 PM Selinda RAMAN wrote: Red Word that prompted transfer to Nurse Triage: The patient called in stating she has a lump on the left side that goes to her back. She is in sever pain and it sounds like it is taking her breath. I will transfer her to E2C2 NT Reason for Disposition  [1] SEVERE pain (e.g., excruciating) AND [2] present > 1 hour  Answer Assessment - Initial Assessment Questions 1. LOCATION: Where does it hurt?      L abd,  2. RADIATION: Does the pain shoot anywhere else? (e.g., chest, back)     back 3. ONSET: When did the pain begin? (e.g., minutes, hours or days ago)      Since last week when in the clinic 4. SUDDEN: Gradual or sudden onset?     Gradual, pain varies in severity 5. PATTERN Does the pain come and go, or is it constant?     constant 6. SEVERITY: How bad is the pain?  (e.g., Scale 1-10; mild, moderate, or severe)     6 Pt states that her pain, is a 6. Pt is at [facility], but not checked in. Pt breathing heavy and sounds tearful she states pain is bad. Pt asked RN to speak to staff at [facility]. [facility] states that they need images sent to them, and her PCP to order an MRI as the MRI will be scheduled quicker through PCP. Pt advised to seek ED treatment as she is crying on the phone call.  Protocols used: Abdominal Pain - Female-A-AH

## 2024-02-16 ENCOUNTER — Ambulatory Visit (INDEPENDENT_AMBULATORY_CARE_PROVIDER_SITE_OTHER): Admitting: Internal Medicine

## 2024-02-16 ENCOUNTER — Encounter: Payer: Self-pay | Admitting: Internal Medicine

## 2024-02-16 VITALS — BP 118/68 | HR 109 | Ht 64.0 in | Wt 200.0 lb

## 2024-02-16 DIAGNOSIS — K5792 Diverticulitis of intestine, part unspecified, without perforation or abscess without bleeding: Secondary | ICD-10-CM | POA: Diagnosis not present

## 2024-02-16 DIAGNOSIS — K439 Ventral hernia without obstruction or gangrene: Secondary | ICD-10-CM

## 2024-02-16 DIAGNOSIS — R8281 Pyuria: Secondary | ICD-10-CM | POA: Diagnosis not present

## 2024-02-16 NOTE — Progress Notes (Signed)
 Date:  02/16/2024   Name:  Hayley Ewing   DOB:  25-Aug-1945   MRN:  [id number]   Chief Complaint: Hospitalization Follow-up Hospital follow up. Admitted to [facility] 7/23/-02/15/24 for abdominal pain, dx diverticulitis.  No TOC call was done.  HPI  Admission Diagnoses:  Diverticulitis [K57.92] Pyelonephritis [N12] Left lower quadrant pain [R10.32] Abdominal wall hernia [K43.9]  Discharge Diagnoses:  Principal Problem: Diverticulitis Active Problems: Heart failure with improved ejection fraction (HFimpEF) (CMS/HHS-HCC) Essential hypertension AKI (acute kidney injury) () Iron deficiency anemia Chronic pain syndrome Asthma (HHS-HCC) Urinary tract infection with hematuria Resolved Problems: * No resolved hospital problems. * Primary Diagnosis: Admitted for abdominal pain c/f diverticulits  Changes Made (with rationale):  Levaquin  750 mg PO daily and flagyl  500 mg PO q12h for diverticulitis   To-Do List (incidental findings, follow-up studies, etc.): Perform outpatient colonoscopy once diverticulitis managed, last one in 2021 had poor prep. Assess for CRC concerns.  Incidental hepatic lesion noted. Consider contrast-enhanced liver MRI or three-phase liver CT (per radiology) Follow-up on urine culture results, consider adjusting antibiotics if needed    Review of Systems  Constitutional:  Negative for chills, fatigue and fever.  Respiratory:  Negative for chest tightness and shortness of breath.   Cardiovascular:  Negative for chest pain and palpitations.  Gastrointestinal:  Negative for abdominal distention, abdominal pain, constipation and diarrhea.  Psychiatric/Behavioral:  Negative for dysphoric mood and sleep disturbance. The patient is not nervous/anxious.      Lab Results  Component Value Date   NA 140 01/29/2020   K 5.0 01/29/2020   CO2 25 (A) 01/29/2020   GLUCOSE 82 12/18/2019   BUN 21 01/29/2020   CREATININE 1.1 01/29/2020   CALCIUM 10.1 01/29/2020   GFRNONAA  49 01/29/2020   Lab Results  Component Value Date   CHOL 218 (H) 10/22/2019   HDL 92 10/22/2019   LDLCALC 105 (H) 10/22/2019   TRIG 124 10/22/2019   CHOLHDL 2.4 10/22/2019   Lab Results  Component Value Date   TSH 0.448 (L) 10/22/2019   Lab Results  Component Value Date   HGBA1C 5.6 10/22/2019   Lab Results  Component Value Date   WBC 7.5 01/29/2020   HGB 12.0 01/29/2020   HCT 40 01/29/2020   MCV 79 12/18/2019   PLT 265 01/29/2020   Lab Results  Component Value Date   ALT 22 10/22/2019   AST 11 10/22/2019   ALKPHOS 58 10/22/2019   BILITOT 0.5 10/22/2019   No results found for: MARIEN BOLLS, VD25OH   Patient Active Problem List   Diagnosis Date Noted   Generalized anxiety disorder 10/02/2023   Age-related osteoporosis without current pathological fracture 10/13/2022   Mild intermittent asthma 01/31/2022   Acquired absence of both cervix and uterus 03/03/2020   Long term (current) use of anticoagulants 01/23/2020   Chronic diastolic heart failure (HCC) 01/23/2020   Iron deficiency anemia 12/18/2019   Current chronic use of systemic steroids 11/15/2019   Generalized osteoarthritis 10/22/2019   Heart failure with reduced ejection fraction (HCC) 09/16/2019   Chondrocalcinosis 01/15/2018   Rheumatoid arthritis involving multiple sites with positive rheumatoid factor (HCC) 01/15/2018   Colitis 08/31/2017   Essential hypertension 08/10/2017   Displacement of lumbar intervertebral disc without myelopathy 01/30/2017   Venous insufficiency of both lower extremities 06/14/2016   Bladder cystocele 05/19/2015   S/P hip replacement, right 05/19/2015    No Known Allergies  Past Surgical History:  Procedure Laterality Date   REPLACEMENT TOTAL KNEE  Right 07/28/2020   TOTAL ABDOMINAL HYSTERECTOMY  1998   TOTAL HIP ARTHROPLASTY Right 2014    Social History   Tobacco Use   Smoking status: Never    Passive exposure: Past   Smokeless tobacco: Never    Tobacco comments:    smoking cessation materials not required  Vaping Use   Vaping status: Never Used  Substance Use Topics   Alcohol use: Not Currently   Drug use: No     Medication list has been reviewed and updated.  Current Meds  Medication Sig   albuterol  (VENTOLIN  HFA) 108 (90 Base) MCG/ACT inhaler Inhale 2 puffs into the lungs every 6 (six) hours as needed for wheezing or shortness of breath.   busPIRone  (BUSPAR ) 10 MG tablet Take 1 tablet (10 mg total) by mouth 2 (two) times daily.   docusate sodium (COLACE) 100 MG capsule Colace 100 mg capsule  Take 1 capsule twice a day by oral route as needed.   DULoxetine (CYMBALTA) 60 MG capsule Take 60 mg by mouth 2 (two) times daily.   ferrous sulfate 325 (65 FE) MG tablet Take 325 mg by mouth daily with breakfast.   folic acid (FOLVITE) 1 MG tablet    furosemide  (LASIX ) 40 MG tablet Take 40 mg by mouth.   JARDIANCE 10 MG TABS tablet Take 10 mg by mouth daily.   levofloxacin  (LEVAQUIN ) 500 MG tablet Take 500 mg by mouth daily.   lidocaine  (LIDODERM ) 5 % Place 1 patch onto the skin daily. Remove & Discard patch within 12 hours or as directed by MD   losartan (COZAAR) 25 MG tablet Take 25 mg by mouth daily.   methotrexate 2.5 MG tablet    metoprolol  succinate (TOPROL -XL) 25 MG 24 hr tablet Take 12.5 mg by mouth daily.   metroNIDAZOLE  (FLAGYL ) 500 MG tablet Take 500 mg by mouth 2 (two) times daily.   NARCAN 4 MG/0.1ML LIQD nasal spray kit SMARTSIG:1 Spray(s) Both Nares Once PRN   oxyCODONE-acetaminophen  (PERCOCET) 10-325 MG tablet Take 1 tablet by mouth 4 (four) times daily as needed.   XARELTO 10 MG TABS tablet Take 10 mg by mouth daily.   Current Facility-Administered Medications for the 02/16/24 encounter (Office Visit) with Justus Leita DEL, MD  Medication   albuterol  (PROVENTIL ) (2.5 MG/3ML) 0.083% nebulizer solution 2.5 mg       02/16/2024    1:43 PM 02/06/2024    8:48 AM 11/13/2023    2:53 PM 10/02/2023    3:26 PM  GAD 7 :  Generalized Anxiety Score  Nervous, Anxious, on Edge 0 0 0 3  Control/stop worrying 0 0 0 3  Worry too much - different things 0 0 1 3  Trouble relaxing 0 0 3 3  Restless 0 0 3 3  Easily annoyed or irritable 0 0 0 3  Afraid - awful might happen 0 0 0 0  Total GAD 7 Score 0 0 7 18  Anxiety Difficulty Not difficult at all Not difficult at all Somewhat difficult Somewhat difficult       02/16/2024    1:43 PM 02/06/2024    8:47 AM 12/21/2023    2:47 PM  Depression screen PHQ 2/9  Decreased Interest 0 0 0  Down, Depressed, Hopeless 0 0 0  PHQ - 2 Score 0 0 0  Altered sleeping 0 0 0  Tired, decreased energy 0 0 1  Change in appetite 0 0 0  Feeling bad or failure about yourself  0 0 0  Trouble concentrating 0 0 0  Moving slowly or fidgety/restless 0 0 0  Suicidal thoughts 0 0 0  PHQ-9 Score 0 0 1  Difficult doing work/chores Not difficult at all Not difficult at all Not difficult at all    BP Readings from Last 3 Encounters:  02/16/24 118/68  02/06/24 122/78  11/13/23 118/76    Physical Exam Vitals and nursing note reviewed.  Constitutional:      General: She is not in acute distress.    Appearance: Normal appearance. She is well-developed.  HENT:     Head: Normocephalic and atraumatic.  Cardiovascular:     Rate and Rhythm: Normal rate and regular rhythm.     Heart sounds: No murmur heard. Pulmonary:     Effort: Pulmonary effort is normal. No respiratory distress.     Breath sounds: No wheezing or rhonchi.  Abdominal:     General: There is no distension.     Palpations: Abdomen is soft.     Tenderness: There is no abdominal tenderness.  Musculoskeletal:     Cervical back: Normal range of motion.     Right lower leg: No edema.     Left lower leg: No edema.  Lymphadenopathy:     Cervical: No cervical adenopathy.  Skin:    General: Skin is warm and dry.     Findings: No rash.  Neurological:     Mental Status: She is alert and oriented to person, place, and time.   Psychiatric:        Mood and Affect: Mood normal.        Behavior: Behavior normal.     Wt Readings from Last 3 Encounters:  02/16/24 200 lb (90.7 kg)  02/06/24 191 lb (86.6 kg)  12/21/23 196 lb (88.9 kg)    BP 118/68   Pulse (!) 109   Ht 5' 4 (1.626 m)   Wt 200 lb (90.7 kg)   SpO2 94%   BMI 34.33 kg/m   Assessment and Plan:  Problem List Items Addressed This Visit   None Visit Diagnoses       Diverticulitis    -  Primary   now on Levaquin  and Flagyl  for 7 days. She has no more pain and wants to advance her diet refer to [facility]   Relevant Orders   Ambulatory referral to Gastroenterology     Pyuria       push fluids; wait for Urine culture     Hernia of abdominal wall       fat containing only on CT does not need any treatment at this time       No follow-ups on file.    Leita HILARIO Adie, MD [facility] [place]

## 2024-02-19 ENCOUNTER — Ambulatory Visit: Payer: Self-pay

## 2024-02-19 NOTE — Telephone Encounter (Addendum)
 First attempt; no answer Second attempt; no answer Third attempt; no answer    Copied from CRM [id number]. Topic: Clinical - Medical Advice >> Feb 19, 2024 10:23 AM Myrick T wrote: Reason for CRM: patient said her spouse was diagnosed with Covid today and she has a bad cough and needs to know what she can do a she is on an antibiotic for colon infection and urinary infection. Please f/u with patient to let her know what she can do.

## 2024-02-21 ENCOUNTER — Ambulatory Visit: Payer: Self-pay

## 2024-02-21 NOTE — Telephone Encounter (Signed)
 FYI Only or Action Required?: FYI only for provider.  Patient was last seen in primary care on 02/16/2024 by Justus Leita DEL, MD.  Called Nurse Triage reporting coronavirus exposure and Respiratory Distress.  Symptoms began several days ago.  Interventions attempted: Albuterol  inhaler during call  Symptoms are: rapidly worsening.  Triage Disposition: Go to ED Now (Notify PCP)  Patient/caregiver understands and will follow disposition?: yes Pt does not have transportation to ED- Advised 911. Called 911 and pt was alert and SOB at time of disconnect- Fire trucks were at her house when disconnected with 911.             Copied from CRM [id number]. Topic: Clinical - Red Word Triage >> Feb 21, 2024 10:48 AM Nathanel BROCKS wrote: Red Word that prompted transfer to Nurse Triage:   Pt has been sick with inf in colon and was given medication. She said she thinks its given her thrush in her mouth and a really bad cough. Cough is down in her chest. Reason for Disposition  [1] MODERATE difficulty breathing (e.g., speaks in phrases, SOB even at rest, pulse 100-120) AND [2] NEW-onset or WORSE than normal  Answer Assessment - Initial Assessment Questions 1. COVID-19 EXPOSURE: Please describe how you were exposed to someone with a COVID-19 infection.     spouse 2. PLACE of CONTACT: Where were you when you were exposed to COVID-19? (e.g., home, school, medical waiting room; which city?)     home 3. TYPE of CONTACT: How much contact was there? (e.g., sitting next to, live in same house, work in same office, same building)     Live in same house 4. DURATION of CONTACT: How long were you in contact with the COVID-19 patient? (e.g., a few seconds, passed by person, a few minutes, 15 minutes or longer, live with the patient)     Live with spouse 6. DATE of CONTACT: When did you have contact with a COVID-19 patient? (e.g., how many days ago)     unsure 7. COMMUNITY SPREAD: Do you live in  or have you traveled to an area where there are lots of COVID-19 cases (community spread)? (See public health department website, if unsure)       *No Answer* 8. SYMPTOMS: Do you have any symptoms? (e.g., fever, cough, breathing difficulty, loss of taste or smell)     Cough with severe coughing spells, oral thrush, SOB, white phlegm, cannot sleep  10. PREGNANCY OR POSTPARTUM: Is there any chance you are pregnant? When was your last menstrual period? Did you deliver in the last 2 weeks?       *No Answer* 11. HIGH RISK: Do you have any heart or lung problems? (e.g., asthma, COPD, heart failure) Do you have a weak immune system or other risk factors? (e.g., HIV positive, chemotherapy, renal failure, diabetes mellitus, sickle cell anemia, obesity)       *No Answer*  Answer Assessment - Initial Assessment Questions 1. RESPIRATORY STATUS: Describe your breathing? (e.g., wheezing, shortness of breath, unable to speak, severe coughing)      SOB, speaking in phrases, severe coughing, wheezing 4. SEVERITY: How bad is your breathing? (e.g., mild, moderate, severe)      moderate 7. LUNG HISTORY: Do you have any history of lung disease?  (e.g., pulmonary embolus, asthma, emphysema)     asthma 8. CAUSE: What do you think is causing the breathing problem?      Covid  9. OTHER SYMPTOMS: Do you have any other symptoms? (  e.g., chest pain, cough, dizziness, fever, runny nose)     Also has thrush  Protocols used: Coronavirus (COVID-19) Exposure-A-AH, Breathing Difficulty-A-AH

## 2024-02-21 NOTE — Telephone Encounter (Signed)
 Spoke with patient. Explained to patient that Dr Justus is on vacation until 08/11. She only wants to see Dr Justus and would not see another provider. Informed pt I cannot send in medication without Dr Justus approving this and because of her sxs she needs to be seen in person. Patient said she will be seen in person at the Urgent Care.  - Brookley Spitler M.

## 2024-02-21 NOTE — Telephone Encounter (Signed)
 Pt states that she is calling back after nurse Camie RAMAN called 911 for her, pt states that 5 EMTs came and gave her the choice to go to the ED and she chose not to go to the ED-per pt. Pt states that she was told to use her albuterol  and rest. Pt states that she would like to  speak to Dr Christene nurse.  During call pt speaks in 3-4 word sentences, when asked about SOB pt states well everyone keeps making me talk, I just need rest. During call pt states that she feels she has thrush, this RN triaged pt for her oral s/s. Attempted to scheduled pt states I am not coming in, that is to far, I live in [place], I have been a patient of Dr Justus for years.  Pt refused appt.  Reason for Disposition  [1] White patches that stick to tongue or inner cheek AND [2] can be wiped off  Answer Assessment - Initial Assessment Questions 1. SYMPTOM: What's the main symptom you're concerned about? (e.g., chapped lips, dry mouth, lump, sores)     White pus on back of throat 2. ONSET: When did the  mouth white spots  start?     about 3. PAIN: Is there any pain? If Yes, ask: How bad is it? (Scale: 0-10; or none, mild, moderate, severe)     No pain current 4. CAUSE: What do you think is causing the symptoms?     Thrush, taking abx for UTI and colon infection 5. OTHER SYMPTOMS: Do you have any other symptoms? (e.g., fever, sore throat, toothache, swelling)     denies  Protocols used: Mouth Symptoms-A-AH

## 2024-02-21 NOTE — Telephone Encounter (Signed)
 Tried to call patient. Could not reach her to discuss. If she returns the call, she needs to go to the ER. Dr Justus is not here - she is on vacation until 03/04/2024. If patient is having SOB that is worse than her normal, she needs to go to ER and call 911. & The doctors here cannot treat her unless she is seen.   - Brynli Ollis M.

## 2024-02-21 NOTE — Telephone Encounter (Signed)
 Noted  Pt called 911.  KP

## 2024-03-04 ENCOUNTER — Ambulatory Visit: Payer: Self-pay

## 2024-03-04 NOTE — Telephone Encounter (Signed)
 FYI Only or Action Required?: FYI only for provider.  Patient was last seen in primary care on 02/16/2024 by Justus Leita DEL, MD.  Called Nurse Triage reporting Pain.  Symptoms began yesterday.  Interventions attempted: Nothing.  Symptoms are: gradually worsening.  Triage Disposition: See PCP When Office is Open (Within 3 Days)  Patient/caregiver understands and will follow disposition?: Yes    Copied from CRM #[id number]. Topic: Clinical - Red Word Triage >> Mar 04, 2024 12:05 PM Marissa P wrote: Red Word that prompted transfer to Nurse Triage: Patient is having pain in back severe pain, in hospital for a day July 30th had a colon infection. Went to [facility] yesterday and when she got home cried it hurts so bad. Would like to be seen right away or maybe referred to somebody.  Nurse Deidre is who she would prefer speaking with if possible. Reason for Disposition  [1] MODERATE back pain (e.g., interferes with normal activities) AND [2] present > 3 days  Answer Assessment - Initial Assessment Questions 1. ONSET: When did the pain begin? (e.g., minutes, hours, days)     yesterday 2. LOCATION: Where does it hurt? (upper, mid or lower back)     Low back 3. SEVERITY: How bad is the pain?  (e.g., Scale 1-10; mild, moderate, or severe)     10/10 4. PATTERN: Is the pain constant? (e.g., yes, no; constant, intermittent)      Comes and goes 5. RADIATION: Does the pain shoot into your legs or somewhere else?     na 6. CAUSE:  What do you think is causing the back pain?      N/a 7. BACK OVERUSE:  Any recent lifting of heavy objects, strenuous work or exercise?     N/a 8. MEDICINES: What have you taken so far for the pain? (e.g., nothing, acetaminophen , NSAIDS)     N/a 9. NEUROLOGIC SYMPTOMS: Do you have any weakness, numbness, or problems with bowel/bladder control?     na 10. OTHER SYMPTOMS: Do you have any other symptoms? (e.g., fever, abdomen pain, burning with  urination, blood in urine)       na 11. PREGNANCY: Is there any chance you are pregnant? When was your last menstrual period?       no  Protocols used: Back Pain-A-AH

## 2024-03-05 ENCOUNTER — Ambulatory Visit (INDEPENDENT_AMBULATORY_CARE_PROVIDER_SITE_OTHER): Admitting: Internal Medicine

## 2024-03-05 ENCOUNTER — Encounter: Payer: Self-pay | Admitting: Internal Medicine

## 2024-03-05 VITALS — BP 120/68 | HR 73 | Ht 64.0 in | Wt 205.2 lb

## 2024-03-05 DIAGNOSIS — M5126 Other intervertebral disc displacement, lumbar region: Secondary | ICD-10-CM

## 2024-03-05 MED ORDER — METHOCARBAMOL 500 MG PO TABS: 500.0000 mg | ORAL_TABLET | Freq: Two times a day (BID) | ORAL | 0 refills | Status: DC

## 2024-03-05 NOTE — Assessment & Plan Note (Signed)
 Contributing to back and joint pains, ambulatory dysfunction.

## 2024-03-05 NOTE — Assessment & Plan Note (Signed)
 Continue lidocaine  patches and Oxycodone. Add Robaxin  bid prn and ice.

## 2024-03-05 NOTE — Progress Notes (Signed)
 Date:  03/05/2024   Name:  Hayley Ewing   DOB:  1945-11-08   MRN:  [id number]   Chief Complaint: Back Pain (Lower back pain. No other symptoms. Pt went to [employer] yesterday and the pain was so bad, she had to hurry home. Pt had trouble putting her groceries in the car. )  Back Pain This is a new problem. The current episode started yesterday. The problem occurs constantly. The problem has been gradually improving since onset. The pain is present in the lumbar spine. The quality of the pain is described as aching. The pain does not radiate. The pain is moderate. Pertinent negatives include no abdominal pain, chest pain, headaches or weakness. She has tried analgesics (oxycodone that she takes qid) for the symptoms. The treatment provided moderate relief.    Review of Systems  Constitutional:  Negative for fatigue and unexpected weight change.  HENT:  Negative for trouble swallowing.   Eyes:  Negative for visual disturbance.  Respiratory:  Negative for cough, chest tightness, shortness of breath and wheezing.   Cardiovascular:  Negative for chest pain, palpitations and leg swelling.  Gastrointestinal:  Negative for abdominal pain, constipation and diarrhea.  Musculoskeletal:  Positive for back pain and gait problem. Negative for arthralgias and myalgias.  Neurological:  Negative for dizziness, weakness, light-headedness and headaches.     Lab Results  Component Value Date   NA 140 01/29/2020   K 5.0 01/29/2020   CO2 25 (A) 01/29/2020   GLUCOSE 82 12/18/2019   BUN 21 01/29/2020   CREATININE 1.1 01/29/2020   CALCIUM 10.1 01/29/2020   GFRNONAA 49 01/29/2020   Lab Results  Component Value Date   CHOL 218 (H) 10/22/2019   HDL 92 10/22/2019   LDLCALC 105 (H) 10/22/2019   TRIG 124 10/22/2019   CHOLHDL 2.4 10/22/2019   Lab Results  Component Value Date   TSH 0.448 (L) 10/22/2019   Lab Results  Component Value Date   HGBA1C 5.6 10/22/2019   Lab Results  Component Value  Date   WBC 7.5 01/29/2020   HGB 12.0 01/29/2020   HCT 40 01/29/2020   MCV 79 12/18/2019   PLT 265 01/29/2020   Lab Results  Component Value Date   ALT 22 10/22/2019   AST 11 10/22/2019   ALKPHOS 58 10/22/2019   BILITOT 0.5 10/22/2019   No results found for: MARIEN BOLLS, VD25OH   Patient Active Problem List   Diagnosis Date Noted   Obesity, morbid (HCC) 03/05/2024   Generalized anxiety disorder 10/02/2023   Age-related osteoporosis without current pathological fracture 10/13/2022   Mild intermittent asthma 01/31/2022   Acquired absence of both cervix and uterus 03/03/2020   Long term (current) use of anticoagulants 01/23/2020   Chronic diastolic heart failure (HCC) 01/23/2020   Iron deficiency anemia 12/18/2019   Current chronic use of systemic steroids 11/15/2019   Generalized osteoarthritis 10/22/2019   Heart failure with reduced ejection fraction (HCC) 09/16/2019   Chondrocalcinosis 01/15/2018   Rheumatoid arthritis involving multiple sites with positive rheumatoid factor (HCC) 01/15/2018   Colitis 08/31/2017   Essential hypertension 08/10/2017   Displacement of lumbar intervertebral disc without myelopathy 01/30/2017   Venous insufficiency of both lower extremities 06/14/2016   Bladder cystocele 05/19/2015   S/P hip replacement, right 05/19/2015    No Known Allergies  Past Surgical History:  Procedure Laterality Date   REPLACEMENT TOTAL KNEE Right 07/28/2020   TOTAL ABDOMINAL HYSTERECTOMY  1998   TOTAL HIP ARTHROPLASTY Right  2014    Social History   Tobacco Use   Smoking status: Never    Passive exposure: Past   Smokeless tobacco: Never   Tobacco comments:    smoking cessation materials not required  Vaping Use   Vaping status: Never Used  Substance Use Topics   Alcohol use: Not Currently   Drug use: No     Medication list has been reviewed and updated.  Current Meds  Medication Sig   albuterol  (VENTOLIN  HFA) 108 (90 Base) MCG/ACT  inhaler Inhale 2 puffs into the lungs every 6 (six) hours as needed for wheezing or shortness of breath.   busPIRone  (BUSPAR ) 10 MG tablet Take 1 tablet (10 mg total) by mouth 2 (two) times daily.   docusate sodium (COLACE) 100 MG capsule Colace 100 mg capsule  Take 1 capsule twice a day by oral route as needed.   DULoxetine (CYMBALTA) 60 MG capsule Take 60 mg by mouth 2 (two) times daily.   ferrous sulfate 325 (65 FE) MG tablet Take 325 mg by mouth daily with breakfast.   folic acid (FOLVITE) 1 MG tablet    furosemide  (LASIX ) 40 MG tablet Take 40 mg by mouth.   JARDIANCE 10 MG TABS tablet Take 10 mg by mouth daily.   lidocaine  (LIDODERM ) 5 % Place 1 patch onto the skin daily. Remove & Discard patch within 12 hours or as directed by MD   losartan (COZAAR) 25 MG tablet Take 25 mg by mouth daily.   methocarbamol  (ROBAXIN ) 500 MG tablet Take 1 tablet (500 mg total) by mouth 2 (two) times daily.   methotrexate 2.5 MG tablet    metoprolol  succinate (TOPROL -XL) 25 MG 24 hr tablet Take 12.5 mg by mouth daily.   NARCAN 4 MG/0.1ML LIQD nasal spray kit SMARTSIG:1 Spray(s) Both Nares Once PRN   oxyCODONE-acetaminophen  (PERCOCET) 10-325 MG tablet Take 1 tablet by mouth 4 (four) times daily as needed.   XARELTO 10 MG TABS tablet Take 10 mg by mouth daily.   Current Facility-Administered Medications for the 03/05/24 encounter (Office Visit) with Justus Leita DEL, MD  Medication   albuterol  (PROVENTIL ) (2.5 MG/3ML) 0.083% nebulizer solution 2.5 mg       03/05/2024    9:03 AM 02/16/2024    1:43 PM 02/06/2024    8:48 AM 11/13/2023    2:53 PM  GAD 7 : Generalized Anxiety Score  Nervous, Anxious, on Edge 0 0 0 0  Control/stop worrying 0 0 0 0  Worry too much - different things 1 0 0 1  Trouble relaxing 0 0 0 3  Restless 0 0 0 3  Easily annoyed or irritable 0 0 0 0  Afraid - awful might happen 0 0 0 0  Total GAD 7 Score 1 0 0 7  Anxiety Difficulty Not difficult at all Not difficult at all Not difficult  at all Somewhat difficult       03/05/2024    9:03 AM 02/16/2024    1:43 PM 02/06/2024    8:47 AM  Depression screen PHQ 2/9  Decreased Interest 1 0 0  Down, Depressed, Hopeless 0 0 0  PHQ - 2 Score 1 0 0  Altered sleeping 1 0 0  Tired, decreased energy 2 0 0  Change in appetite 0 0 0  Feeling bad or failure about yourself  0 0 0  Trouble concentrating 0 0 0  Moving slowly or fidgety/restless 0 0 0  Suicidal thoughts 0 0 0  PHQ-9 Score  4 0 0  Difficult doing work/chores Not difficult at all Not difficult at all Not difficult at all    BP Readings from Last 3 Encounters:  03/05/24 120/68  02/16/24 118/68  02/06/24 122/78    Physical Exam Vitals and nursing note reviewed.  Constitutional:      General: She is not in acute distress.    Appearance: Normal appearance. She is well-developed. She is obese.  HENT:     Head: Normocephalic and atraumatic.  Cardiovascular:     Rate and Rhythm: Normal rate and regular rhythm.     Heart sounds: No murmur heard. Pulmonary:     Effort: Pulmonary effort is normal. No respiratory distress.     Breath sounds: No wheezing or rhonchi.  Musculoskeletal:     Cervical back: Normal range of motion.     Lumbar back: Spasms and bony tenderness present. No tenderness. Negative right straight leg raise test and negative left straight leg raise test.     Right lower leg: Edema present.     Left lower leg: Edema present.  Lymphadenopathy:     Cervical: No cervical adenopathy.  Skin:    General: Skin is warm and dry.     Findings: No rash.  Neurological:     Mental Status: She is alert and oriented to person, place, and time.     Motor: Motor function is intact.     Gait: Gait abnormal (walks with cane).  Psychiatric:        Mood and Affect: Mood normal.        Behavior: Behavior normal.     Wt Readings from Last 3 Encounters:  03/05/24 205 lb 3.2 oz (93.1 kg)  02/16/24 200 lb (90.7 kg)  02/06/24 191 lb (86.6 kg)    BP 120/68    Pulse 73   Ht 5' 4 (1.626 m)   Wt 205 lb 3.2 oz (93.1 kg)   SpO2 98%   BMI 35.22 kg/m   Assessment and Plan:  Problem List Items Addressed This Visit       Unprioritized   Displacement of lumbar intervertebral disc without myelopathy - Primary   Continue lidocaine  patches and Oxycodone. Add Robaxin  bid prn and ice.      Relevant Medications   methocarbamol  (ROBAXIN ) 500 MG tablet   Obesity, morbid (HCC)   Contributing to back and joint pains, ambulatory dysfunction.       No follow-ups on file.    Leita HILARIO Adie, MD [facility] [place]

## 2024-05-29 ENCOUNTER — Other Ambulatory Visit: Payer: Self-pay | Admitting: Internal Medicine

## 2024-05-29 DIAGNOSIS — F411 Generalized anxiety disorder: Secondary | ICD-10-CM

## 2024-05-29 NOTE — Telephone Encounter (Unsigned)
 Copied from CRM [id number]. Topic: Clinical - Medication Refill >> May 29, 2024  9:54 AM Nathanel BROCKS wrote: Medication: busPIRone  (BUSPAR ) 10 MG tablet  Has the patient contacted their pharmacy? Yes  This is the patient's preferred pharmacy:  [id number] [place], [place] - [address] [place] [address] Phone: [phone] Fax: [phone]  Is this the correct pharmacy for this prescription? Yes If no, delete pharmacy and type the correct one.   Has the prescription been filled recently? Yes  Is the patient out of the medication? Yes  Has the patient been seen for an appointment in the last year OR does the patient have an upcoming appointment? Yes  Can we respond through MyChart? No  Agent: Please be advised that Rx refills may take up to 3 business days. We ask that you follow-up with your pharmacy.

## 2024-05-30 MED ORDER — BUSPIRONE HCL 10 MG PO TABS: 10.0000 mg | ORAL_TABLET | Freq: Two times a day (BID) | ORAL | 1 refills | Status: AC

## 2024-05-30 NOTE — Telephone Encounter (Signed)
 Requested Prescriptions  Pending Prescriptions Disp Refills   busPIRone  (BUSPAR ) 10 MG tablet 180 tablet 1    Sig: Take 1 tablet (10 mg total) by mouth 2 (two) times daily.     Psychiatry: Anxiolytics/Hypnotics - Non-controlled Passed - 05/30/2024  3:52 PM      Passed - Valid encounter within last 12 months    Recent Outpatient Visits           2 months ago Displacement of lumbar intervertebral disc without myelopathy   [place] [facility] at [facility], Leita DEL, MD   3 months ago Diverticulitis   [facility] at [facility], Leita DEL, MD   3 months ago Left lower quadrant abdominal pain   [place] [facility] at [facility], Leita DEL, MD   6 months ago Generalized anxiety disorder   [facility] at [facility], Leita DEL, MD   8 months ago Generalized anxiety disorder   [facility] at [facility], Leita DEL, MD

## 2024-06-12 ENCOUNTER — Ambulatory Visit (INDEPENDENT_AMBULATORY_CARE_PROVIDER_SITE_OTHER): Admitting: Internal Medicine

## 2024-06-12 ENCOUNTER — Encounter: Payer: Self-pay | Admitting: Internal Medicine

## 2024-06-12 VITALS — BP 122/78 | HR 94 | Ht 64.0 in | Wt 196.0 lb

## 2024-06-12 DIAGNOSIS — I5020 Unspecified systolic (congestive) heart failure: Secondary | ICD-10-CM | POA: Diagnosis not present

## 2024-06-12 DIAGNOSIS — M5126 Other intervertebral disc displacement, lumbar region: Secondary | ICD-10-CM

## 2024-06-12 DIAGNOSIS — D509 Iron deficiency anemia, unspecified: Secondary | ICD-10-CM | POA: Diagnosis not present

## 2024-06-12 DIAGNOSIS — I10 Essential (primary) hypertension: Secondary | ICD-10-CM

## 2024-06-12 DIAGNOSIS — J4520 Mild intermittent asthma, uncomplicated: Secondary | ICD-10-CM | POA: Diagnosis not present

## 2024-06-12 MED ORDER — ALBUTEROL SULFATE HFA 108 (90 BASE) MCG/ACT IN AERS: 2.0000 | INHALATION_SPRAY | Freq: Four times a day (QID) | RESPIRATORY_TRACT | 0 refills | Status: AC | PRN

## 2024-06-12 MED ORDER — METHOCARBAMOL 500 MG PO TABS: 500.0000 mg | ORAL_TABLET | Freq: Two times a day (BID) | ORAL | 0 refills | Status: AC

## 2024-06-12 NOTE — Progress Notes (Signed)
 Date:  06/12/2024   Name:  Hayley Ewing   DOB:  [birth year]   MRN:  [id number]   Chief Complaint: No chief complaint on file.   Hypertension This is a chronic problem. The problem is controlled. Pertinent negatives include no chest pain, headaches, palpitations or shortness of breath. Past treatments include angiotensin blockers, beta blockers and diuretics. The current treatment provides significant improvement. Hypertensive end-organ damage includes CAD/MI. There is no history of kidney disease or CVA.  Asthma There is no cough, shortness of breath or wheezing. This is a recurrent problem. The problem occurs intermittently. The problem has been unchanged. Pertinent negatives include no chest pain, headaches, myalgias or trouble swallowing. Her symptoms are alleviated by beta-agonist. She reports significant improvement on treatment. Her past medical history is significant for asthma.  HF with improved EF - followed by cardiology - on BB and Jardiance. Anemia - iron deficiency - seen by Hematology and had iron infusions done last in December 06/2023.   Review of Systems  Constitutional:  Negative for fatigue and unexpected weight change.  HENT:  Negative for trouble swallowing.   Eyes:  Negative for visual disturbance.  Respiratory:  Negative for cough, chest tightness, shortness of breath and wheezing.   Cardiovascular:  Negative for chest pain, palpitations and leg swelling.  Gastrointestinal:  Negative for abdominal pain, constipation and diarrhea.  Musculoskeletal:  Positive for arthralgias and gait problem (knee pain). Negative for myalgias.  Neurological:  Negative for dizziness, weakness, light-headedness and headaches.  Psychiatric/Behavioral:  Negative for dysphoric mood and sleep disturbance. The patient is not nervous/anxious.      Lab Results  Component Value Date   NA 140 01/29/2020   K 5.0 01/29/2020   CO2 25 (A) 01/29/2020   GLUCOSE 82 12/18/2019   BUN 21  01/29/2020   CREATININE 1.1 01/29/2020   CALCIUM 10.1 01/29/2020   GFRNONAA 49 01/29/2020   Lab Results  Component Value Date   CHOL 218 (H) 10/22/2019   HDL 92 10/22/2019   LDLCALC 105 (H) 10/22/2019   TRIG 124 10/22/2019   CHOLHDL 2.4 10/22/2019   Lab Results  Component Value Date   TSH 0.448 (L) 10/22/2019   Lab Results  Component Value Date   HGBA1C 5.6 10/22/2019   Lab Results  Component Value Date   WBC 7.5 01/29/2020   HGB 12.0 01/29/2020   HCT 40 01/29/2020   MCV 79 12/18/2019   PLT 265 01/29/2020   Lab Results  Component Value Date   ALT 22 10/22/2019   AST 11 10/22/2019   ALKPHOS 58 10/22/2019   BILITOT 0.5 10/22/2019   No results found for: MARIEN BOLLS, VD25OH   Patient Active Problem List   Diagnosis Date Noted   Obesity, morbid (HCC) 03/05/2024   Generalized anxiety disorder 10/02/2023   Age-related osteoporosis without current pathological fracture 10/13/2022   Mild intermittent asthma 01/31/2022   Acquired absence of both cervix and uterus 03/03/2020   Long term (current) use of anticoagulants 01/23/2020   Chronic diastolic heart failure (HCC) 01/23/2020   Iron deficiency anemia 12/18/2019   Current chronic use of systemic steroids 11/15/2019   Generalized osteoarthritis 10/22/2019   Heart failure with improved ejection fraction (HFimpEF) (HCC) 09/16/2019   Chondrocalcinosis 01/15/2018   Rheumatoid arthritis involving multiple sites with positive rheumatoid factor (HCC) 01/15/2018   Colitis 08/31/2017   Essential hypertension 08/10/2017   Displacement of lumbar intervertebral disc without myelopathy 01/30/2017   Venous insufficiency of both lower extremities 06/14/2016  Bladder cystocele 05/19/2015   S/P hip replacement, right 05/19/2015    No Known Allergies  Past Surgical History:  Procedure Laterality Date   REPLACEMENT TOTAL KNEE Right 07/28/2020   TOTAL ABDOMINAL HYSTERECTOMY  1998   TOTAL HIP ARTHROPLASTY Right  2014    Social History   Tobacco Use   Smoking status: Never    Passive exposure: Past   Smokeless tobacco: Never   Tobacco comments:    smoking cessation materials not required  Vaping Use   Vaping status: Never Used  Substance Use Topics   Alcohol use: Not Currently   Drug use: No     Medication list has been reviewed and updated.  Current Meds  Medication Sig   busPIRone  (BUSPAR ) 10 MG tablet Take 1 tablet (10 mg total) by mouth 2 (two) times daily.   chlorpheniramine-HYDROcodone  (TUSSIONEX) 10-8 MG/5ML Take 5 mLs by mouth at bedtime as needed for cough.   diazepam (VALIUM) 5 MG tablet TAKE ONE TABLET BY MOUTH EVERY 12 HOURS AS NEEDED FOR ANXIETY  BRING WITH YOU TO THE PROCEDURE ON THE DAY OF YOUR APPOINTMENT)   docusate sodium (COLACE) 100 MG capsule Colace 100 mg capsule  Take 1 capsule twice a day by oral route as needed.   DULoxetine (CYMBALTA) 60 MG capsule Take 60 mg by mouth 2 (two) times daily.   ferrous sulfate 325 (65 FE) MG tablet Take 325 mg by mouth daily with breakfast.   folic acid (FOLVITE) 1 MG tablet    furosemide  (LASIX ) 40 MG tablet Take 40 mg by mouth.   JARDIANCE 10 MG TABS tablet Take 10 mg by mouth daily.   lidocaine  (LIDODERM ) 5 % Place 1 patch onto the skin daily. Remove & Discard patch within 12 hours or as directed by MD   losartan (COZAAR) 25 MG tablet Take 25 mg by mouth daily.   methotrexate 2.5 MG tablet    metoprolol  succinate (TOPROL -XL) 25 MG 24 hr tablet Take 12.5 mg by mouth daily.   NARCAN 4 MG/0.1ML LIQD nasal spray kit SMARTSIG:1 Spray(s) Both Nares Once PRN   oxyCODONE-acetaminophen  (PERCOCET) 10-325 MG tablet Take 1 tablet by mouth 4 (four) times daily as needed.   XARELTO 10 MG TABS tablet Take 10 mg by mouth daily.   [DISCONTINUED] albuterol  (VENTOLIN  HFA) 108 (90 Base) MCG/ACT inhaler Inhale 2 puffs into the lungs every 6 (six) hours as needed for wheezing or shortness of breath.   [DISCONTINUED] methocarbamol  (ROBAXIN ) 500 MG  tablet Take 1 tablet (500 mg total) by mouth 2 (two) times daily.   Current Facility-Administered Medications for the 06/12/24 encounter (Office Visit) with Justus Leita DEL, MD  Medication   albuterol  (PROVENTIL ) (2.5 MG/3ML) 0.083% nebulizer solution 2.5 mg       03/05/2024    9:03 AM 02/16/2024    1:43 PM 02/06/2024    8:48 AM 11/13/2023    2:53 PM  GAD 7 : Generalized Anxiety Score  Nervous, Anxious, on Edge 0 0 0 0  Control/stop worrying 0 0 0 0  Worry too much - different things 1 0 0 1  Trouble relaxing 0 0 0 3  Restless 0 0 0 3  Easily annoyed or irritable 0 0 0 0  Afraid - awful might happen 0 0 0 0  Total GAD 7 Score 1 0 0 7  Anxiety Difficulty Not difficult at all Not difficult at all Not difficult at all Somewhat difficult       03/05/2024  9:03 AM 02/16/2024    1:43 PM 02/06/2024    8:47 AM  Depression screen PHQ 2/9  Decreased Interest 1 0 0  Down, Depressed, Hopeless 0 0 0  PHQ - 2 Score 1 0 0  Altered sleeping 1 0 0  Tired, decreased energy 2 0 0  Change in appetite 0 0 0  Feeling bad or failure about yourself  0 0 0  Trouble concentrating 0 0 0  Moving slowly or fidgety/restless 0 0 0  Suicidal thoughts 0 0 0  PHQ-9 Score 4  0  0   Difficult doing work/chores Not difficult at all Not difficult at all Not difficult at all     Data saved with a previous flowsheet row definition    BP Readings from Last 3 Encounters:  06/12/24 122/78  03/05/24 120/68  02/16/24 118/68    Physical Exam Vitals and nursing note reviewed.  Constitutional:      General: She is not in acute distress.    Appearance: She is well-developed.  HENT:     Head: Normocephalic and atraumatic.  Cardiovascular:     Rate and Rhythm: Normal rate and regular rhythm.     Heart sounds: No murmur heard. Pulmonary:     Effort: Pulmonary effort is normal. No respiratory distress.     Breath sounds: No wheezing or rhonchi.  Musculoskeletal:     Cervical back: Normal range of motion.   Lymphadenopathy:     Cervical: No cervical adenopathy.  Skin:    General: Skin is warm and dry.     Capillary Refill: Capillary refill takes less than 2 seconds.     Findings: No rash.  Neurological:     General: No focal deficit present.     Mental Status: She is alert and oriented to person, place, and time.  Psychiatric:        Mood and Affect: Mood normal.        Behavior: Behavior normal.     Wt Readings from Last 3 Encounters:  06/12/24 196 lb (88.9 kg)  03/05/24 205 lb 3.2 oz (93.1 kg)  02/16/24 200 lb (90.7 kg)    BP 122/78   Pulse 94   Ht 5' 4 (1.626 m)   Wt 196 lb (88.9 kg)   SpO2 98%   BMI 33.64 kg/m   Assessment and Plan:  Problem List Items Addressed This Visit       Unprioritized   Displacement of lumbar intervertebral disc without myelopathy   Relevant Medications   methocarbamol  (ROBAXIN ) 500 MG tablet   Essential hypertension - Primary (Chronic)   Well controlled blood pressure today. Current regimen is losartan, metoprolol  and furosemide . No medication side effects noted.        Heart failure with improved ejection fraction (HFimpEF) (HCC)   Followed by Cardiology - on Jardiance and metoprolol .      Iron deficiency anemia   Was seeing Hematology last year and had iron infusion in December but none since and no follow up. CBC every three months - brief drop in July but then up to 11.9 in September Follow up with Hematology as needed      Mild intermittent asthma (Chronic)   Relevant Medications   albuterol  (VENTOLIN  HFA) 108 (90 Base) MCG/ACT inhaler    Return in 6 months (on 12/10/2024) for Medicare annual - Dr. Sol.    Leita HILARIO Adie, MD [facility] [place]

## 2024-06-12 NOTE — Assessment & Plan Note (Signed)
 Well controlled blood pressure today. Current regimen is losartan, metoprolol  and furosemide . No medication side effects noted.

## 2024-06-12 NOTE — Assessment & Plan Note (Addendum)
 Was seeing Hematology last year and had iron infusion in December but none since and no follow up. CBC every three months - brief drop in July but then up to 11.9 in September Follow up with Hematology as needed

## 2024-06-12 NOTE — Assessment & Plan Note (Signed)
 Followed by Cardiology - on Jardiance and metoprolol .

## 2024-12-10 ENCOUNTER — Encounter: Admitting: Family Medicine

## 2025-01-02 ENCOUNTER — Ambulatory Visit
